# Patient Record
Sex: Male | Born: 1975 | Race: Black or African American | Hispanic: No | Marital: Single
Health system: Midwestern US, Community
[De-identification: ages and names within clinical notes are randomized; demographics above are authoritative.]

## PROBLEM LIST (undated history)

## (undated) DIAGNOSIS — I10 Essential (primary) hypertension: Secondary | ICD-10-CM

---

## 2017-10-20 ENCOUNTER — Inpatient Hospital Stay: Admit: 2017-10-20 | Discharge: 2017-10-20 | Disposition: A | Discharge status: Home / Self Care

## 2017-10-20 DIAGNOSIS — L309 Dermatitis, unspecified: Secondary | ICD-10-CM

## 2017-10-20 MED ORDER — DEXAMETHASONE SOD PHOSPHATE PF 10 MG/ML IJ SOLN
10 MG/ML | Freq: Once | INTRAMUSCULAR | Status: AC
Start: 2017-10-20 — End: 2017-10-20
  Administered 2017-10-20: 19:00:00 10 mg via INTRAMUSCULAR

## 2017-10-20 MED ORDER — CLINDAMYCIN HCL 300 MG PO CAPS
300 MG | ORAL_CAPSULE | Freq: Three times a day (TID) | ORAL | 0 refills | Status: AC
Start: 2017-10-20 — End: 2017-10-30

## 2017-10-20 MED FILL — DEXAMETHASONE SOD PHOSPHATE PF 10 MG/ML IJ SOLN: 10 mg/mL | INTRAMUSCULAR | Qty: 1

## 2017-10-20 NOTE — ED Provider Notes (Signed)
**[facility]  EMERGENCY DEPARTMENT ENCOUNTER      Pt Name: Larry Tyler  RUE:[id number]  Birthdate [birth year]  Date of evaluation: 10/20/2017  Provider: Cain Saupe, PA-C      Chief Complaint:    Chief Complaint   Patient presents with   . Dental Pain     x 2 weeks bilateral sides, top and bottom.    . Other     flare up of his exzema        Nursing Notes, Past Medical Hx, Past Surgical Hx, Social Hx, Allergies, and Family Hx were all reviewed and agreed with or any disagreements were addressed in the HPI.    HPI:  (Location, Duration, Timing, Severity,Quality, Assoc Sx, Context, Modifying factors)  This is a  42 y.o. male who presents to the emergency department today with a chief complaint of eczema exacerbation.  He is seeing a dermatologist and states he was recently prescribed steroids.  He states his dermatologist told him when the steroids were off that his eczema would worsen.  He is here requesting a steroid shot.  He states his dermatologist is starting him on a new medication and advised him he may need a few steroid shots until he is able to start the medication.  Patient denies any possible allergic reaction, shortness of breath, throat swelling or difficulty swallowing.  He has had no fevers or chills.    Patient also complains of chronic dental pain.  Most of his teeth are severely decayed.  He denies facial swelling.     PastMedical/Surgical History:  History reviewed. No pertinent past medical history.  History reviewed. No pertinent surgical history.    Medications:  Discharge Medication List as of 10/20/2017  3:03 PM            Review of Systems:  Review of Systems   Constitutional: Negative for chills and fever.   HENT: Positive for dental problem.    Respiratory: Negative for chest tightness and shortness of breath.    Cardiovascular: Negative for chest pain.   Gastrointestinal: Negative for abdominal  pain, diarrhea, nausea and vomiting.   Genitourinary: Negative for difficulty urinating and dysuria.   Skin: Positive for rash.   Neurological: Negative for dizziness, light-headedness, numbness and headaches.   All other systems reviewed and are negative.    Positives and Pertinent negatives as per HPI.  Except as noted above in the ROS, problem specific ROS was completed and is negative.    Physical Exam:  Physical Exam   Constitutional: He is oriented to person, place, and time. He appears well-developed and well-nourished.   HENT:   Head: Normocephalic and atraumatic.   Mouth/Throat: Dental caries present.   All of patient's remaining teeth are severely decayed.  There is no sign of acute infection or dental abscess.  Remainder of the HEENT exam is unremarkable.   Eyes: Right eye exhibits no discharge. Left eye exhibits no discharge.   Neck: Normal range of motion. Neck supple.   Cardiovascular: Normal rate, regular rhythm, normal heart sounds and intact distal pulses.   Pulmonary/Chest: Effort normal and breath sounds normal. No respiratory distress.   Musculoskeletal: Normal range of motion.   Neurological: He is alert and oriented to person, place, and time.   Skin: Skin is warm and dry. Rash noted. He is not diaphoretic. No pallor.   Patient has an eczematous rash.  There  is no sign of secondary cellulitis or infection.   Psychiatric: He has a normal mood and affect. His behavior is normal.   Nursing note and vitals reviewed.      MEDICAL DECISION MAKING    Vitals:    Vitals:    10/20/17 1431   BP: (!) 147/99   Pulse: 78   Resp: 16   Temp: 98.4 F (36.9 C)   TempSrc: Oral   SpO2: 97%   Weight: 245 lb (111.1 kg)   Height: 5\' 10"  (1.778 m)       MEDICAL DECISION MAKING / ED COURSE:      PROCEDURES:   Procedures    None    Patient was given:  Medications   dexamethasone (PF) (DECADRON) injection 10 mg (10 mg Intramuscular Given 10/20/17 1500)       Patient presented with a flareup of his eczema.  He is waiting  to be started on a new medication by his dermatologist.  He is given Decadron 10 mg IM at his request. Patient is afebrile and nontoxic in appearance. The rash is currently without the appearance or clinical features to suggest a more emergent diagnosis such as SJS, HSP, TEN, meningococcemia, endocarditis, syphillis, cellulitis, scabies, herpes simplex or erythema multiform, etc.     Patient also presented complaining of chronic dental pain.  He has severely decayed teeth.  There is no sign of acute infection.  He is requesting antibiotics despite advising the patient antibiotics are not really what he needs at this point.  He is adamant about receiving antibiotics and will be given a prescription of clindamycin.  He states he will follow-up with his dentist. Patient denies dysphasia, dyspnea, neck stiffness or odynophagia. There is no brawny edema, uvular deviation, meningismus, stridor, trismus or drooling. I estimate there is LOW risk for a DEEP SPACE INFECTION (e.g., LUDWIG'S ANGINA OR RETROPHARYNGEAL ABSCESS), MENINGITIS, or AIRWAY COMPROMISE. There is also NO HEART MURMUR NOTED ON EXAM thus I consider the discharge disposition reasonable.  Patient will be given the dental clinic list and advised to start calling first thing tomorrow morning to schedule a follow-up visit.  Patient is advised to return to the emergency department with any concerns.       The patient tolerated their visit well.  I evaluated the patient.  The physician was available for consultation as needed.  The patient and / or the family were informed of the results of anytests, a time was given to answer questions, a plan was proposed and they agreed with plan.      CLINICAL IMPRESSION:  1. Eczema, unspecified type    2. Chronic dental pain    3. Pain due to dental caries        DISPOSITION Decision To Discharge 10/20/2017 02:46:07 PM      PATIENT REFERRED TO:  A Dentist    Schedule an appointment as soon as possible for a visit          DISCHARGE MEDICATIONS:  Discharge Medication List as of 10/20/2017  3:03 PM      START taking these medications    Details   clindamycin (CLEOCIN) 300 MG capsule Take 1 capsule by mouth 3 times daily for 10 days May sub Generic and 150 mg capsules and 2x the amount, Disp-30 capsule, R-0Print             DISCONTINUED MEDICATIONS:  Discharge Medication List as of 10/20/2017  3:03 PM                 (  Please note the MDM and HPI sections of this note were completed with a voice recognition program.  Efforts weremade to edit the dictations but occasionally words are mis-transcribed.)    Electronically signed, Cain Saupe, PA-C,          Cain Saupe, PA-C  10/20/17 1545

## 2017-10-20 NOTE — ED Notes (Signed)
Discharge instructions discussed with pt; verbalized understanding. Discussed all new medications (Cleocin) use and side effects; verbalized understanding. Discussed follow up with dentist; verbalized understanding. All questions answered. Pt d/c home with all of belongings.        Janee MornPaige A Jolayne Branson, RN  10/20/17 1512

## 2017-12-18 ENCOUNTER — Inpatient Hospital Stay: Admit: 2017-12-18 | Discharge: 2017-12-18 | Disposition: A | Discharge status: Home / Self Care

## 2017-12-18 DIAGNOSIS — K047 Periapical abscess without sinus: Secondary | ICD-10-CM

## 2017-12-18 MED ORDER — dexamethasone (PF) (DECADRON) injection Soln 10 mg
10 | Freq: Once | INTRAMUSCULAR | Status: AC
Start: 2017-12-18 — End: 2017-12-18
  Administered 2017-12-18: 21:00:00 10 mg via INTRAMUSCULAR

## 2017-12-18 MED ORDER — clindamycin (CLEOCIN) 150 MG capsule
150 | ORAL_CAPSULE | Freq: Four times a day (QID) | ORAL | 0 refills | Status: AC
Start: 2017-12-18 — End: 2017-12-25

## 2017-12-18 MED FILL — DEXAMETHASONE SODIUM PHOSPHATE (PF) 10 MG/ML INJECTION SOLUTION: 10 10 mg/mL | INTRAMUSCULAR | Qty: 1

## 2017-12-18 NOTE — Unmapped (Signed)
[facility] ED Note    Date of service:  12/18/2017    Reason for Visit: Dental Pain and Skin Problem      Patient History     HPI:  This patient comes to the emergency department with a request for a steroid injection, which he has received in the past for eczema flareups.  He reports that over the past week or so he has had increased flaking and itching related to widespread eczema.  He is typically seen by his doctor in [place], where he is from, but is currently staying here in [place] for reasons related to work.  He reports that this kind of injection has helped with his symptoms in the past.  In addition he also complains of left lower jaw pain and swelling which has been present for approximately 7 days.  The pain has been accompanied by intermittent headache.  He reports that he currently is taking erythromycin which was prescribed to him by his primary care physician in [place] for sores from itching his eczema.  He denies any other symptoms including nausea, vomiting   History reviewed. No pertinent past medical history.    History reviewed. No pertinent surgical history.    Ruben Alvarado  reports that he has been smoking Cigarettes.  He has been smoking about 0.50 packs per day. He has never used smokeless tobacco. He reports that he drinks alcohol. He reports that he does not use drugs.    Previous Medications    No medications on file       Allergies:   Allergies as of 12/18/2017 - Fully Reviewed 12/18/2017   Allergen Reaction Noted   ??? Penicillins Anaphylaxis 12/18/2017   ??? Bactrim [sulfamethoxazole-trimethoprim] Other (See Comments) 12/18/2017       Review of Systems     ROS:  Review of Systems   Constitutional: Positive for fever.        She reports subjective fevers over the last week.   HENT: Negative for ear pain.    Respiratory: Negative for shortness of breath.    Cardiovascular: Negative for chest pain.   Gastrointestinal: Negative for  abdominal pain, nausea and vomiting.   Musculoskeletal: Negative for neck pain.   Skin: Positive for itching and rash.   Neurological: Negative for dizziness.           Physical Exam     ED Triage Vitals [12/18/17 1350]   Vital Signs Group      Temp 98.3 ??F (36.8 ??C)      Temp Source Oral      Heart Rate 102      Heart Rate Source Automatic      Resp 17      SpO2 99 %      BP (!) 174/104      MAP (mmHg) 117      BP Location Right arm      BP Method Automatic      Patient Position Sitting   SpO2 99 %   O2 Device        General:  Nontoxic, well-appearing and in no distress. Resting in bed comfortably.   ??  HEENT:   Normocephalic, atraumatic. There is moderate swelling in the left lower perimandibular region of the patient's jaw.  The pain is centered in the area of tooth number 17.  There is very mild pain to pressure being placed on tooth number 17.  There is no abscess present in the oral cavity.  There  is no trismus present.  Uvula is midline.  There is no swelling in the patient's neck.     ??Neck:  Supple, no JVD; trachea midline    ??  Pulmonary:   Clear to auscultation bilaterally. Normal effort. No distress.   ??  Cardiac:  Regular rate and rhythm; without murmurs, rubs, gallops.   ??  Musculoskeletal:  MAE.      Skin:  Warm, dry. No cyanosis or pallor.  This patient has a eczematous rash with skin flaking present on the skin covering his entire body.  There is increased areas of flaking on his head and back.  There are no abscesses.  There is no erythema, fluctuance or other signs of infection present.  ??  Neuro:  A&O x4.  ??  Psych:  Normal mood and affect for clinical situation.         Diagnostic Studies         Emergency Department Procedures         ED Course and MDM     Ruben Alvarado is a 42 y.o. male who presented to the emergency department with Dental Pain and Skin Problem      This is a patient with a history of eczema who comes in requesting a steroid injection for an eczema flareup.  He has had these  injections in the past with relief.  In addition he has tooth pain and swelling in his left lower  mandibular region and in the area surrounding tooth number 17.  He has been taking erythromycin, which was prescribed by his previous primary care physician in [place].  He was prescribed this for what he reports as sores due to scratching himself.  He had not taken all the antibiotic and begin taking it again.  His only other symptom is subjective fevers that have been present over the last week.  He denies any other symptoms including nausea, vomiting, chest pain, shortness of breath, confusion, difficulty moving secretions.  He is alert and oriented ??4.  Vital signs are stable.  On exam there is moderate swelling in the left lower perimandibular region of the patient's jaw.  The pain is centered in the area of tooth number 17.  There is very mild pain to pressure being placed on tooth number 17.  There is no abscess present in the oral cavity.  There is no trismus present.  Uvula is midline.  There is no swelling in the patient's neck.  The patient has an eczematous rash with skin flaking present on the skin covering his entire body.  There is increased areas of flaking on his head and back.  There are no abscesses.  There is no erythema, fluctuance or other signs of infection present.    This patient's presentation is suggestive of dental pain and swelling from a submandibular abscess.  This patient was given a 10 mg injection of Decadron IM here in the emergency department.  He will be discharged with clindamycin for dental abscess as he has a penicillin allergy.Ruben Alvarado  He will be given return precautions and instructions to follow-up with a dentist.      Critical Care Time (Attendings)        Purvis Sheffield. Dallen Bunte, PA  12/18/17 1739

## 2017-12-18 NOTE — ED Triage Notes (Signed)
Patient comes in with complaints of his eczema flaring up and dental pain.

## 2017-12-18 NOTE — Unmapped (Signed)
Take antibiotic as prescribed until they are all gone, regardless of if symptoms resolve.  Discontinue taking erythromycin.  Follow-up with the dentist for further evaluation.  Return to the emergency department with new or worsening symptoms including nausea, vomiting, fevers, chills, abdominal pain, shortness of breath, inability to swallow secretions or other concerning symptoms.

## 2017-12-18 NOTE — Unmapped (Signed)
ED Attending Attestation Note    Date of service:  12/18/2017    This patient was seen by the advanced practice provider.  I have seen and examined the patient, agree with the workup, evaluation, management and diagnosis.  The care plan has been discussed and I concur.      My assessment reveals a 42 y.o. male who presents with dental pain and eczema.  He has diffuse near complete body skin irritation consistent with eczema.  He states that it flares up occasionally.  He recently moved to the [place] area and does not have a primary care doctor.  He additionally complains of left-sided lower dental pain.  He does have some mild swelling to the left lower mandibular region he does not have any trismus, he is tolerating his secretions, and he is breathing easily.

## 2018-01-17 ENCOUNTER — Inpatient Hospital Stay: Admit: 2018-01-17 | Discharge: 2018-01-18 | Disposition: A | Discharge status: Home / Self Care

## 2018-01-17 ENCOUNTER — Emergency Department: Admit: 2018-01-17

## 2018-01-17 DIAGNOSIS — L309 Dermatitis, unspecified: Secondary | ICD-10-CM

## 2018-01-17 MED ORDER — triamcinolone acetonide (KENALOG-10) injection 10 mg
10 | Freq: Once | INTRAMUSCULAR | Status: AC
Start: 2018-01-17 — End: 2018-01-18

## 2018-01-17 MED ORDER — triamcinolone (KENALOG) 0.1 % cream
0.1 | Freq: Two times a day (BID) | TOPICAL | 0 refills | 1.00000 days | Status: AC
Start: 2018-01-17 — End: 2018-06-29

## 2018-01-17 MED ORDER — triamcinolone (KENALOG) 0.1 % cream
0.1 | Freq: Two times a day (BID) | TOPICAL | Status: AC
Start: 2018-01-17 — End: 2018-01-18
  Administered 2018-01-17: 23:00:00 via TOPICAL

## 2018-01-17 MED ORDER — hydrOXYzine HCl (ATARAX) tablet 25 mg
25 | Freq: Once | ORAL | Status: AC
Start: 2018-01-17 — End: 2018-01-17
  Administered 2018-01-17: 22:00:00 25 mg via ORAL

## 2018-01-17 MED ORDER — ketorolac (TORADOL) injection 15 mg
15 | Freq: Once | INTRAMUSCULAR | Status: AC
Start: 2018-01-17 — End: 2018-01-17
  Administered 2018-01-17: 22:00:00 15 mg via INTRAMUSCULAR

## 2018-01-17 MED FILL — KETOROLAC 15 MG/ML INJECTION SOLUTION: 15 15 mg/mL | INTRAMUSCULAR | Qty: 1

## 2018-01-17 MED FILL — TRIAMCINOLONE ACETONIDE 0.1 % TOPICAL CREAM: 0.1 0.1 % | TOPICAL | Qty: 15

## 2018-01-17 MED FILL — KENALOG 10 MG/ML SUSPENSION FOR INJECTION: 10 10 mg/mL | INTRAMUSCULAR | Qty: 1

## 2018-01-17 MED FILL — HYDROXYZINE HCL 25 MG TABLET: 25 25 MG | ORAL | Qty: 1

## 2018-01-17 NOTE — ED Notes (Signed)
Contacted pharmacy about missing medication. They stated the tube station has been down but is now working and it should be on it's way via tube station and that if I don't receive it soon to call them back. Patient updated.

## 2018-01-17 NOTE — ED Notes (Signed)
Reviewed d/c paperwork with patient. Patient verbalized understanding. No IV in place. Patient ambulated to exit, gait steady.

## 2018-01-17 NOTE — Unmapped (Signed)
Patient presents to ER triage with c/o eczema exacerbation. He reports rash on arms, legs, and chest, and neck. He also c/o right thumb/joint pain.

## 2018-01-17 NOTE — Unmapped (Addendum)
Apply triamcinolome to areas twice daily. In the meantime, apply lotion to areas. follow-up with dermatology as an outpatient. Call about the injection prescription you are anticipated receiving soon.  No signs of infection at this time.  Return if experience persistent fevers, nausea/vomiting, warmth or redness to the area, or significantly worsening pain.

## 2018-01-17 NOTE — ED Notes (Signed)
Patient states he does not wish to wait any longer for the medication. MD notified.

## 2018-01-17 NOTE — Unmapped (Signed)
[facility] ED Note        Patient History     HPI: Ruben Alvarado is a 42 y.o. male with PMH eczema who presents with complaints of worsening eczema. The patient states that he follows with the dermatologist in [place], but has been in [place] for the past month and a half due to a job.  Patient states that he has been unable to Right back to [place] he had.  Patient states that he is currently in the process of being prescribed a vaccination for his eczema, stating that this should be approved within the next week.  Patient has been applying Eucerin cream to the skin, but states his bowel movements triamcinolone cream, and cannot afford this, so feels his symptoms are worsening.  Patient endorses diffuse pruritic dry skin to bilateral arms and legs.  Patient states he feels his skin cracking when he is around a lot of heat, especially at work around the fractures.  Patient does also endorse right thumb pain that has been worsening over the past few days and worsens when he flexes the thumb.  Has not taken anything yet for his symptoms.  Denies recent work injury.  Denies fevers, chills, erythematous skin, drainage from an area in skin, new soaps/improved, nausea or vomiting.     History reviewed. No pertinent past medical history.    History reviewed. No pertinent surgical history.     reports that he has been smoking cigarettes. He has been smoking about 0.50 packs per day. He has never used smokeless tobacco. He reports current alcohol use. He reports that he does not use drugs.    Discharge Medication List as of 01/17/2018  8:37 PM          Allergies:   Allergies as of 01/17/2018 - Fully Reviewed 01/17/2018   Allergen Reaction Noted   ??? Penicillins Anaphylaxis 12/18/2017   ??? Bactrim [sulfamethoxazole-trimethoprim] Other (See Comments) 12/18/2017       Review of Systems     ROS: ROS    All other ROS were negative.    Physical Exam     ED Triage Vitals  [01/17/18 1524]   Vital Signs Group      Temp 97.5 ??F (36.4 ??C)      Temp Source Oral      Heart Rate 90      Heart Rate Source Automatic      Resp 16      SpO2 97 %      BP 136/84      MAP (mmHg) 96      BP Location Right arm      BP Method Automatic      Patient Position Sitting   SpO2 97 %   O2 Device None (Room air)     Vitals:    01/17/18 1524   BP: 136/84   BP Location: Right arm   Patient Position: Sitting   Pulse: 90   Resp: 16   Temp: 97.5 ??F (36.4 ??C)   TempSrc: Oral   SpO2: 97%      Temp Readings from Last 2 Encounters:   01/17/18 97.5 ??F (36.4 ??C) (Oral)   12/18/17 98.3 ??F (36.8 ??C) (Oral)     BP Readings from Last 2 Encounters:   01/17/18 136/84   12/18/17 (!) 174/104     Pulse Readings from Last 2 Encounters:   01/17/18 90   12/18/17 102        Constitutional:  Well developed,  well nourished, no acute distress, non-toxic appearance   Eyes:  Sclera anicteric, conjunctiva normal.  HEENT:  Atraumatic, external ears normal, oropharynx moist.   Neck: normal range of motion, supple.  Respiratory:  No respiratory distress, normal breath sounds, Good air movement   Cardiovascular:  Regular rate, 2+ pulse.  GI:  Soft, nondistended, no rebound, no guarding.   Musculoskeletal:  No edema, no deformities.   Skin:  Extremely dry skin to entire body including scalp, bilateral upper and lower extremities.  Some areas of excoriation likely secondary to scratching, but no warmth or erythema noted.  No areas of drainage able to be expressed.  No signs of superimposed infection.  No mucosal involvement.  Neurologic:  Awake and alert.  Moves all four extremities.  Light touch intact.    Psychiatric:  Normal mood.  Behavior appropriate.      Diagnostic Studies     Labs:      Labs Reviewed - No data to display    Radiology:  XR HAND RIGHT MINIMUM 3-VIEWS  AMB REFERRAL TO DERMATOLOGY  X-ray Hand Right min 3-views   Final Result   IMPRESSION:   No acute osseous abnormality.      Approved by Dagmar Hait, MD on 01/17/2018  5:28 PM EST      I have personally reviewed the images and I agree with this report.      Report Verified by: Micheline Rough, MD at 01/17/2018 5:33 PM EST          Emergency Department Procedures         ED Course and MDM     [name] is a 42 y.o. male who presented to the emergency department with complaints of ongoing skin rash and right thumb pain.    Patient does have evidence of severe eczema to bilateral upper and lower extremities with some areas of excoriation, likely secondary to patient scratching these areas.  No signs of superimposed infection at this time, as there is no warmth, erythema, or any signs of abscesses.  Triamcinolone cream was given in the ED and patient was encouraged to apply cream to areas of worse symptoms.  Kenalog injection was ordered, as patient stated this has helped the symptoms in the past, but did not come up from pharmacy in time, so patient left prior to receiving this medication.  Patient was also given a referral to dermatology for outpatient follow-up.    As for patient's right thumb, patient for range of motion and was able to make okay sign and perform finger opposition with all digits.  X-ray was ordered and showed no abnormality.  Patient was given Toradol for symptoms.  Patient thumb pain is likely related to arthritis.  Discussed that patient may apply ice to the area and that overuse with worsening symptoms.  Discussed if the patient may take Tylenol or ibuprofen as needed for pain.    The patient was given pertinent discharge instructions related to their diagnosis, reasons to return to the emergency department as well as follow up instructions.     Risks, benefits, and alternatives were discussed. At this time the patient has been deemed safe for discharge. My customary discharge instructions including strict return precautions for worsening or new symptoms have been communicated.  Please see AVS for further details.    Meds given in ED or prescribed for  discharge:  Medications   ketorolac (TORADOL) injection 15 mg (15 mg Intramuscular Given 01/17/18 1712)   hydrOXYzine HCl (ATARAX)  tablet 25 mg (25 mg Oral Given 01/17/18 1712)       Clinical Impression:  1. Eczema, unspecified type        Patient Referred to:    No follow-up provider specified.    Discharge Medications:  Discharge Medication List as of 01/17/2018  8:37 PM      START taking these medications    Details   triamcinolone (KENALOG) 0.1 % cream Apply topically 2 times a day., Starting Thu 01/17/2018, Print, Disp-45 g, R-0             No future appointments.      Critical Care Time (Attendings)          Salome Arnt, PA  01/18/18 2030

## 2018-01-30 ENCOUNTER — Emergency Department: Admit: 2018-01-31

## 2018-01-30 DIAGNOSIS — R079 Chest pain, unspecified: Principal | ICD-10-CM

## 2018-01-30 MED ORDER — ondansetron (ZOFRAN) injection 4 mg
4 | Freq: Once | INTRAMUSCULAR | Status: AC
Start: 2018-01-30 — End: 2018-01-31
  Administered 2018-01-31: 05:00:00 4 mg via INTRAVENOUS

## 2018-01-30 NOTE — Unmapped (Signed)
Pt to cec with c/o CP that started 30 minutes ago. Per pt he felt a squeeze and then became SOB. Per pt the pain is still there but much less. The pain did not travel.    ASA per EMS  ekg WNL per EMS

## 2018-01-30 NOTE — Unmapped (Signed)
Pt to ED via EMS with c/o of squeezing chest pain that started less than an hour ago. 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 EMS gave pt 325 mg aspirin en route.

## 2018-01-30 NOTE — Unmapped (Addendum)
[place] ED Note    Date of Service: 01/30/2018  Reason for Visit: Chest Pain      Patient History     HPI  Ruben Alvarado is a 42 y.o. male with past history significant for eczema presents for evaluation of chest pain.  He notes that this began one hour prior while he was walking, described as both sharp and dull in nature.  He notes associated shortness of breath and that this chest pain isn't exertional, nonpleuritic.  This pain is reproducible on palpation.  He denies any trauma or injury.  He notes that earlier donated plasma, which was the 1st time in 1 year.  He notes nausea currently.  He notes some current chills.  He notes that his eczema is chronic on both his upper and lower extremities.  He denies any fevers, vomiting, abdominal pain, constipation, diarrhea.    Other than stated above, no additional aggravating or alleviating factors are noted.    History reviewed. No pertinent past medical history.  History reviewed. No pertinent surgical history.  Patient  reports that he has been smoking cigarettes. He has a 7.50 pack-year smoking history. He has never used smokeless tobacco. He reports current alcohol use. He reports that he does not use drugs.  Previous Medications    TRIAMCINOLONE (KENALOG) 0.1 % CREAM    Apply topically 2 times a day.       Allergies:   Allergies as of 01/30/2018 - Fully Reviewed 01/30/2018   Allergen Reaction Noted   ??? Penicillins Anaphylaxis 12/18/2017   ??? Bactrim [sulfamethoxazole-trimethoprim] Other (See Comments) 12/18/2017       All nursing notes and triage notes were appropriately reviewed in the course of the creation of this note.     Review of Systems     ROS  All other systems are negative except as mentioned in HPI.    Physical Exam     Vitals:    01/30/18 2337   BP: (!) 160/98   BP Location: Left arm   Patient Position: Lying   Pulse: 102   Resp: 26   Temp: 98.8 ??F (37.1 ??C)   TempSrc: Oral   SpO2: 100%        General: ??Shaking in bed  Eyes: ??Pupils reactive. No discharge from eyes   ENT: ??No discharge from nose. OP clear  Neck: ??Supple, trachea midline  Pulmonary: ?? Non-labored breathing. Breath sounds clear bilaterally  Cardiac: ??Regular rate and rhythm. No murmurs  Abdomen: ??Soft. Non-tender. Non-distended  Musculoskeletal: ??No long bone deformity.?? No CVA tenderness.  Tender to palpation over mid sternum.  Vascular: ??Extremities warm and perfused. Normal pulses in all 4 extremities  Skin: ??Dry, no rashes  Extremities: ??Chronic eczematous changes to both upper extremities and lower extremities.  Right lower extremity grossly enlarged compared to left.  Neuro: ??Alert. Moves all four extremities to command. No focal deficit  Neuro: ??Alert and oriented x 4. CN II-XII intact. 5/5 strength in all 4 extremities. Sensation grossly intact to light touch. Speech and mentation normal.?? Gait narrow and stable    Diagnostic Studies     Labs:  Please see electronic medical record for any tests performed in the ED    Radiology:  CT Angio Chest W and or WO   Final Result   IMPRESSION:   No evidence of pulmonary emboli, evaluation of the lingular vessels is limited by motion.      Approved by Ruben Lima, DO on 01/31/2018 1:54 AM EST  I have personally reviewed the images and I agree with this report.      Report Verified by: Ruben Herald, MD at 01/31/2018 2:03 AM EST      X-ray Chest PA and Lateral   Final Result   IMPRESSION:    No acute cardiopulmonary abnormality.      Approved by Ruben Dural, DO on 01/31/2018 12:48 AM EST      I have personally reviewed the images and I agree with this report.      Report Verified by: Ruben Herald, MD at 01/31/2018 1:01 AM EST          EKG:    EKG Interpretation    Interpreted by emergency department physician    Rhythm: normal sinus   Rate: 100  Axis: normal  Ectopy: none  Conduction: normal  ST Segments: no acute change  T Waves: no acute change  Q Waves: none    Clinical Impression: no  acute changes    [facility]    Emergency Department Procedures       ED Course and MDM     Ruben Alvarado is a 42 y.o. male with a history and presentation as described above in HPI.  The patient was evaluated by myself and the ED Attending Physician, Dr. Janee Alvarado, and R4 Dr. Bayard Alvarado. All management and disposition plans were discussed and agreed upon.    Upon presentation, the patient was shaking, afebrile, hypertensive, mildly tachycardic and tachypneic, saturating well on room air.  Initial concern with right unilateral lower extremity edema and chest pain for PE at this time.  D-dimer was elevated at 1.42, CTPA was pursued which did not find any acute pulmonary embolism.  EKG without dynamic changes and negative troponin making myocardial infarction unlikely. EKG is also not consistent with pericarditis. Pneumonia, aortic dissection and pneumothorax are unlikely based on the results of their CXR.  Patient has a HEART score of 2 with obesity, hypertension, description of pain.  Patient at this time admitted to observation unit for deep vein thrombosis study of this right lower extremity with positive d-dimer and echocardiogram for workup of his chest pain.  Lovenox started for this patient for presumed deep vein thrombosis.    Medications received during this ED visit:  Medications   ondansetron (ZOFRAN) injection 4 mg (has no administration in time range)       At this time the patient has been admitted to CDU for further evaluation and management of chest pain, DVT. The patient will continue to be monitored here in the emergency department until which time he is moved to his new treatment location.      Impression     1. Chest pain, unspecified type         Plan   1. The patient is to be admitted in stable/improved condition  2. Workup, treatment and diagnosis were discussed with the patient and/or family members; the patient agrees to the plan and all questions were addressed and answered.    Ruben Coombes, MD, PGY-1  [facility]    Critical Care Time (Attendings)              Ruben Coombes, MD  Resident  01/31/18 1610       Ruben Coombes, MD  Resident  01/31/18 [phone]

## 2018-01-30 NOTE — Unmapped (Signed)
ED Attending ED Observation Disposition Attestation Note    Date of service:  01/30/2018    This patient was seen by the advanced practice provider.  I have seen and examined the patient, agree with the workup, evaluation, management and diagnosis. The care plan has been discussed and I concur.     My assessment reveals a 42 y.o. male who presents to the Emergency Department with syncope.  Underwent surgery.  Workup appears negative including echo he is asymptomatic.

## 2018-01-30 NOTE — ED Notes (Signed)
I was the attending physician responsible for placing this patient into observation.  The patient will be observed for further evaluation of chest pain that occurred while walking.    His breath sounds are clear bilaterally     Karren Burly, MD  01/31/18 2212

## 2018-01-30 NOTE — Unmapped (Signed)
MD at bedside.

## 2018-01-30 NOTE — Unmapped (Signed)
Bed: B28U  Expected date:   Expected time:   Means of arrival:   Comments:  EMS

## 2018-01-30 NOTE — Unmapped (Signed)
ED Attending Attestation Note    Date of service:  01/30/2018    This patient was seen by the resident physician.  I have seen and examined the patient, agree with the workup, evaluation, management and diagnosis. The care plan has been discussed and I concur.  I have reviewed the ECG and concur with the resident's interpretation.    My assessment reveals a 42 y.o. male With past history of tobacco use and severe eczema.  Today the patient donated plasma for the 1st time.  He states that he received a bag of IV fluids with his donation.  Following this he developed pain all over his body.  The patient was walking and he was feeling central chest discomfort and was having difficulty breathing.  The patient then fainted and woke up in the ambulance.  He is continuing to complain of generalized body pain.    On physical exam the patient appears comfortable.  His breath sounds are clear bilaterally, S1-S2

## 2018-01-31 ENCOUNTER — Observation Stay: Admit: 2018-01-31 | Discharge: 2018-05-10 | Payer: MEDICAID

## 2018-01-31 ENCOUNTER — Emergency Department: Admit: 2018-01-31

## 2018-01-31 ENCOUNTER — Inpatient Hospital Stay
Admission: EM | Admit: 2018-01-31 | Discharge: 2018-02-01 | Disposition: A | Discharge status: Home / Self Care | Payer: MEDICAID

## 2018-01-31 DIAGNOSIS — R079 Chest pain, unspecified: Secondary | ICD-10-CM

## 2018-01-31 LAB — CBC
Hematocrit: 43.5 % (ref 38.5–50.0)
Hemoglobin: 14.2 g/dL (ref 13.2–17.1)
MCH: 30.1 pg (ref 27.0–33.0)
MCHC: 32.5 g/dL (ref 32.0–36.0)
MCV: 92.5 fL (ref 80.0–100.0)
MPV: 8.2 fL (ref 7.5–11.5)
Platelets: 248 10*3/uL (ref 140–400)
RBC: 4.7 10*6/uL (ref 4.20–5.80)
RDW: 13.3 % (ref 11.0–15.0)
WBC: 8.5 10*3/uL (ref 3.8–10.8)

## 2018-01-31 LAB — DIFFERENTIAL
Basophils Absolute: 26 /uL (ref 0–200)
Basophils Relative: 0.3 % (ref 0.0–1.0)
Eosinophils Absolute: 255 /uL (ref 15–500)
Eosinophils Relative: 3 % (ref 0.0–8.0)
Lymphocytes Absolute: 995 /uL (ref 850–3900)
Lymphocytes Relative: 11.7 % — ABNORMAL LOW (ref 15.0–45.0)
Monocytes Absolute: 1037 /uL — ABNORMAL HIGH (ref 200–950)
Monocytes Relative: 12.2 % — ABNORMAL HIGH (ref 0.0–12.0)
Neutrophils Absolute: 6188 /uL (ref 1500–7800)
Neutrophils Relative: 72.8 % (ref 40.0–80.0)
nRBC: 0 /100{WBCs} (ref 0–0)

## 2018-01-31 LAB — TROPONIN I
Troponin I: <0.04 ng/mL (ref 0.00–0.03)
Troponin I: <0.04 ng/mL (ref 0.00–0.03)

## 2018-01-31 LAB — BASIC METABOLIC PANEL
Anion Gap: 10 mmol/L (ref 3–16)
BUN: 6 mg/dL — ABNORMAL LOW (ref 7–25)
CO2: 21 mmol/L (ref 21–33)
Calcium: 8.5 mg/dL — ABNORMAL LOW (ref 8.6–10.3)
Chloride: 105 mmol/L (ref 98–110)
Creatinine: 0.88 mg/dL (ref 0.60–1.30)
Glucose: 76 mg/dL (ref 70–100)
Osmolality, Calculated: 278 mosm/kg (ref 278–305)
Potassium: 3.9 mmol/L (ref 3.5–5.3)
Sodium: 136 mmol/L (ref 133–146)
eGFR AA CKD-EPI: >90 See note.
eGFR NONAA CKD-EPI: >90 See note.

## 2018-01-31 LAB — DDIMER: D-Dimer: 1.42 ug{FEU}/mL — ABNORMAL HIGH (ref 0.00–0.50)

## 2018-01-31 LAB — B NATRIURETIC PEPTIDE: BNP: 29 pg/mL (ref 0–100)

## 2018-01-31 MED ORDER — acetaminophen (TYLENOL) tablet 650 mg
325 | ORAL | Status: AC | PRN
Start: 2018-01-31 — End: 2018-01-31

## 2018-01-31 MED ORDER — mineral oil w/petrolatum-lanolin-ceresin (EUCERIN)
TOPICAL | Status: AC | PRN
Start: 2018-01-31 — End: 2018-02-01
  Administered 2018-01-31: 16:00:00 via TOPICAL

## 2018-01-31 MED ORDER — OMNIPAQUE (iohexol) 350 mg iodine/mL 100 mL
350 | Freq: Once | INTRAVENOUS | Status: AC | PRN
Start: 2018-01-31 — End: 2018-01-31
  Administered 2018-01-31: 06:00:00 83 mL via INTRAVENOUS

## 2018-01-31 MED ORDER — enoxaparin (LOVENOX) subcutaneous syringe
150 | SUBCUTANEOUS | Status: AC
Start: 2018-01-31 — End: 2018-02-01
  Administered 2018-01-31: 11:00:00 150 mg/kg via SUBCUTANEOUS

## 2018-01-31 MED ORDER — perflutren lipid microspheres (DEFINITY) injection 2.2 mg
1.1 | Freq: Once | INTRAVENOUS | Status: AC | PRN
Start: 2018-01-31 — End: 2018-01-31
  Administered 2018-01-31: 21:00:00 2.2 mg via INTRAVENOUS

## 2018-01-31 MED ORDER — acetaminophen (TYLENOL) suppository 650 mg
650 | RECTAL | Status: AC | PRN
Start: 2018-01-31 — End: 2018-01-31

## 2018-01-31 MED ORDER — ondansetronZOFRANtablet4mg
4 | Freq: Four times a day (QID) | ORAL | Status: AC | PRN
Start: 2018-01-31 — End: 2018-02-01

## 2018-01-31 MED ORDER — ondansetron (ZOFRAN) tablet 4 mg
4 | Freq: Four times a day (QID) | ORAL | Status: AC | PRN
Start: 2018-01-31 — End: 2018-01-31

## 2018-01-31 MED ORDER — ondansetron (ZOFRAN) injection 4 mg
4 | Freq: Four times a day (QID) | INTRAMUSCULAR | Status: AC | PRN
Start: 2018-01-31 — End: 2018-01-31

## 2018-01-31 MED ORDER — ondansetron (ZOFRAN) injection 4 mg
4 | Freq: Four times a day (QID) | INTRAMUSCULAR | Status: AC | PRN
Start: 2018-01-31 — End: 2018-02-01

## 2018-01-31 MED FILL — OMNIPAQUE 350 MG IODINE/ML INTRAVENOUS SOLUTION: 350 350 mg iodine/mL | INTRAVENOUS | Qty: 100

## 2018-01-31 MED FILL — ONDANSETRON HCL (PF) 4 MG/2 ML INJECTION SOLUTION: 4 4 mg/2 mL | INTRAMUSCULAR | Qty: 2

## 2018-01-31 MED FILL — ENOXAPARIN 150 MG/ML SUBCUTANEOUS SYRINGE: 150 150 mg/mL | SUBCUTANEOUS | Qty: 1

## 2018-01-31 MED FILL — DEFINITY 1.1 MG/ML INTRAVENOUS SUSPENSION: 1.1 1.1 mg/mL | INTRAVENOUS | Qty: 2

## 2018-01-31 MED FILL — MINERIN CREME TOPICAL: TOPICAL | Qty: 454

## 2018-01-31 NOTE — ED Notes (Signed)
RN gave pt sandwich, crackers, peanut butter. MD aware. Pt sitting in bed eating.

## 2018-01-31 NOTE — ED Notes (Signed)
Pt on phone ordering diet tray

## 2018-01-31 NOTE — ED Notes (Signed)
Food tray at bedside.

## 2018-01-31 NOTE — ED Notes (Signed)
Patient to echo via transport and stretcher.

## 2018-01-31 NOTE — Unmapped (Signed)
Patient has been transported to ERCT

## 2018-01-31 NOTE — Unmapped (Signed)
Bed: CD05U  Expected date:   Expected time:   Means of arrival:   Comments:  c35- Brinlee Gambrell, RN [phone]

## 2018-01-31 NOTE — Unmapped (Signed)
Bed: CD13U  Expected date:   Expected time:   Means of arrival:   Comments:  [id number]

## 2018-01-31 NOTE — ED Notes (Signed)
Pt given bedside bath with minimal asisstance. Cream applied for pt eczema. Pt linens and gown changed.

## 2018-01-31 NOTE — Unmapped (Signed)
Assisted patient to the bathroom, he is steady.  He is scratching his skin excessively on his abdomen, until he is bleeding.  Patient off unit for echo.

## 2018-01-31 NOTE — Unmapped (Signed)
Center for Emergency Care Clinical Decision Unit  [facility]  Admission Note/Summary    Date of placement in ED Observation:  No order of PLACE IN ED OBSERVATION is found.      Patient History      Ruben Alvarado is a 42 y.o. male who presented to the Emergency Department with episode of chest pain and syncope.  The patient was walking into a store when he experienced sudden onset of substernal chest pain with associated diaphoresis, nausea and shortness of breath.  His symptoms lasted for 2-3 minutes.  At that time the patient had an episode of syncope and woke up in the ambulance.  The patient did donate plasma prior to these symptoms, and he did receive IV fluids with his donation.  The patient's pain is reproducible on examination.  He denies any trauma or injury to his chest that he noticed.  He denies any associated abdominal pain or vomiting.  On physical exam the providers also noticed right lower extremity swelling.  The primary, initial concern was for PE given the patient's pain combined with syncope.  The acute evaluation for chest pain included a negative troponin, elevated d-dimer at 1.42, CTPA without evidence of pulmonary embolism and EKG without dynamic changes.  He was given Lovenox in the ED given his RLE swelling and elevated d-dimer until we are able to obtain the RLE duplex.     Upon admission to the Clinical Decision Unit, Ruben Alvarado is resting comfortably in bed.  The patient did last consume caffeine at approximately 9 PM so he is not a candidate for GXT, though is low risk with a heart score of 2 calculated by the ED.      Past Medical History   has no past medical history on file.    Past Surgical History   has no past surgical history on file.    Social History  Joanna Drapeau  reports that he has been smoking cigarettes. He has a 7.50 pack-year smoking history. He has never used smokeless tobacco. He reports current alcohol use. He reports that he does  not use drugs.    Family History  Patient denies any pertinent family history as it pertains to the current problem     Medications      Previous Medications    TRIAMCINOLONE (KENALOG) 0.1 % CREAM    Apply topically 2 times a day.          Review of Systems      Positive for chest pain, shortness of breath, nausea, diaphoresis, leg swelling, syncope.  Negative for abdominal pain, vomiting, lightheadedness.     Physical examination      Vitals:    01/30/18 2337 01/31/18 0105 01/31/18 0250 01/31/18 0351   BP: (!) 160/98 145/77 137/79 124/62   BP Location: Left arm      Patient Position: Lying      Pulse: 102 90 93 94   Resp: 26 18 19 12    Temp: 98.8 ??F (37.1 ??C)      TempSrc: Oral      SpO2: 100% 100% 100% 99%       General: 42 y.o. male in no apparent distress, well developed, well nourished, non-toxic appearance. Wrapped in blankets, laying in bed.     HEENT: Atraumatic, normocephalic. EOMs intact.?? Pupils constricted but equal and reactive to light.     Neck:  Full range of motion.     Chest/pulm: Respiratory rate normal. Speaks  in complete sentences, no respiratory distress. Lungs clear without obvious wheezing, rales    Cardiovascular: Heart rate normal. Regular rate and rhythm, no obvious murmurs.     Abdomen: soft without any focal tenderness, rebound or guarding.  No distention.    Musculoskeletal: Moves all extremities equally.  Right lower extremity edema without any overlying erythema or palpable cords.    Neuro: A&O x 4.?? Normal speech without dysarthria or aphasia. Moves all extremities spontaneously and symmetrically.     Skin: Warm, dry. No obvious rashes, petechiae, or cyanosis.     Psych: Appropriate mood and affect, normal interaction.        Diagnostic evaluation      Diagnostic studies and ED interventions germane to this period of clinical observation will include:    - ECHO, given syncope  - RLE duplex           Consultant(s)      1) n/a     Impression and Plan      In summary, Ruben Alvarado is  admitted by to the [facility] for Emergency Care Clinical Decision Unit for chest pain, syncope.  Dr. Janee Morn is the CDU admission attending.      This patient has been risk-stratified based on the available history, physical exam, and related study findings.  Admission to observation status for further diagnosis/treatment/monitoring of chest pain, syncope is warranted clinically.  This extended period of observation is specifically required to determine the need for hospitalization.      The goals of this admission based on the patient's clinical problem list are:    1. Monitor for hemodynamic stability within normal limits  2. Trending troponins  3. RLE duplex  4. ECHO    We will observe the patient for the following endpoints:    1.  No acute events over CDU observation, specifically no recurrent symptoms and no arrhythmias   2. Maintenance of normal, stable vital signs, including orthostatic  3. Normal TTE  4. Serially negative troponin  5. No new changes to initial ECG  6. Improved lab abnormalities  7. Appropriate follow-up in place     When met, appropriate disposition will be arranged.

## 2018-01-31 NOTE — Unmapped (Signed)
Pt resting in bed with lights dimmed. SV rhythm with even and unlabored respirations. Bed is locked and in the lowest position with side rails up and call light within reach. No signs of distress or complaints at this time.

## 2018-01-31 NOTE — Unmapped (Addendum)
You're near fainting today was likely a result of donating plasma.  This is rather common.  Continue to hydrate well over the next 24 hours, eat solid meals, and return to emergency room if increased chest pain/fainting/shortness of breath.    Your echo of your heart and your ultrasound of her leg were normal today.    Apply the provided cream to areas of dry skin consistently throughout the day.  He may apply petroleum jelly as well to retain moisture.  Keep your lower extremities wrapped in the provided ace wraps.  Elevate her legs above the level of her heart several times per day.

## 2018-01-31 NOTE — Unmapped (Signed)
Center for Emergency Care Clinical Decision Unit  [facility]  Progress Note       Subjective      Clements Posadas has been admitted to the CDU for 6 hours.  Serial assessments of his clinical progress reveal:    - Experiencing acute on chronic itching/burning of his back and legs secondary to eczema.  - Otherwise no SOA, CP.       Physical examination     Vital signs:   Vitals:    01/31/18 0351 01/31/18 0506 01/31/18 0539 01/31/18 0659   BP: 124/62  112/47 140/78   BP Location:   Left arm    Patient Position:   Lying    Pulse: 94  92 84   Resp: 12  18 18    Temp:       TempSrc:       SpO2: 99%  97% 100%   Weight:  220 lb (99.8 kg)         General:??well appearing in no apparent state of distress  HEENT:?? normocephalic, atraumatic;  handling secretions  Pulmonary:??respirations even, non labored  Abdomen:?? non-distended, non-tender  Musculoskeletal:?? ambulates under own control, BLE right worse than left.   Neuro: GCS 15, spontaneously moves all extremities  Vascular:?? 2+ peripheral pulses in bilateral upper and lower extremities ??  Skin:?? warm, total body eczema with skin cracking     Assessment and Plan      Fisher Ramone continues to be managed in accordance with the CDU clinical guidelines for syncope, CP.  An update of his clinical problem list includes:    Syncope, CP  ?? CTPA negative for PE  ?? HEART score 2  ?? ECHO today  ?? Likely vasovagal secondary to plasma donation given. No need for stress test today  ?? Hemodynamic monitoring    RLE Edema.   ?? Venous duplex  ?? DVT vs chronic edema secondary to severe eczema  ?? Received 150mg  lovenox in ED at 0537    ---------------------------------------------------------    Helmut Muster PA-C

## 2018-01-31 NOTE — Unmapped (Signed)
MD at bedside.

## 2018-01-31 NOTE — Unmapped (Signed)
Center for Emergency Care Clinical Decision Unit  [facility]  Disposition Note / Summary     Date of placement in ED Observation:   Last order of PLACE IN ED OBSERVATION was found on 01/31/2018 from Hospital Encounter on 01/30/2018        Subjective      Ruben Alvarado has undergone comprehensive diagnostic evaluation and therapeutic management in accordance with the CDU guidelines for near syncope, ?CP. Based on the clinical response and diagnostic information obtained during this period of observation, it has been determined that the patient will be discharged home.       Physical examination      Vital signs:   Vitals:    01/31/18 0659 01/31/18 1105 01/31/18 1443 01/31/18 1454   BP: 140/78  139/78    BP Location:   Left arm    Patient Position:   Lying    Pulse: 84 79 100    Resp: 18 16 16     Temp:   98.1 ??F (36.7 ??C)    TempSrc:   Oral    SpO2: 100% 100% 99%    Weight:    218 lb (98.9 kg)   Height:    5' 10 (1.778 m)       General:??Well appearing, well nourished, in no apparent state of distress.     HEENT:?? Normocephalic, atraumatic. Pupils equal, sclera white. Handling secretions without difficulty.    Neck:??No meningismus. Trachea midline.    Pulmonary:??Respirations even. Non labored. No tachypnea. Good air movement throughout.    Cardiac:??Chest symmetrical. Non-tender on palpation of chest wall. RRR. No M/R/G.     Abdomen:?? Non-distended. Non rigid and non tender to palpation.     Musculoskeletal:?? Ambulates under own control. Atraumatic exam with no focal swelling or tenderness. No peripheral edema. Strength 5/5 in all four extremities.    Neuro:?? Alert and oriented x 3. CN II - XII grossly intact. Moves all extremities spontaneously.    Vascular:?? 2+ peripheral pulses in bilateral upper and lower extremities ??    Skin:?? Warm and well perfused    Psych:?? Appropriate mood and affect ??       Diagnostic evaluation     Diagnostic studies germane to this period of clinical  observation include:    Venous Duplex Lower Ext Ltd - Right   Final Result      CT Angio Chest W and or WO   Final Result   IMPRESSION:   No evidence of pulmonary emboli, evaluation of the lingular vessels is limited by motion.      Approved by Jodean Lima, DO on 01/31/2018 1:54 AM EST      I have personally reviewed the images and I agree with this report.      Report Verified by: Zane Herald, MD at 01/31/2018 2:03 AM EST      X-ray Chest PA and Lateral   Final Result   IMPRESSION:    No acute cardiopulmonary abnormality.      Approved by Ezzie Dural, DO on 01/31/2018 12:48 AM EST      I have personally reviewed the images and I agree with this report.      Report Verified by: Zane Herald, MD at 01/31/2018 1:01 AM EST          Labs Reviewed   BASIC METABOLIC PANEL - Abnormal; Notable for the following components:       Result Value    BUN 6 (*)  Calcium 8.5 (*)     All other components within normal limits   DIFFERENTIAL - Abnormal; Notable for the following components:    Lymphocytes Relative 11.7 (*)     Monocytes Relative 12.2 (*)     Monocytes Absolute 1,037 (*)     All other components within normal limits   DDIMER - Abnormal; Notable for the following components:    D-Dimer 1.42 (*)     All other components within normal limits   CBC   TROPONIN I   B NATRIURETIC PEPTIDE   TROPONIN I        Consultant(s) final recommendations      1)      Impression and Plan      Ruben Alvarado has been cared for according to the standard [facility] for Emergency Care Clinical Decision Unit observation protocol for near syncope. This extended period of observation was specifically required to determine the need for hospitalization. Prior to discharge from observation, the final physical exam is documented above.      Syncope, CP  ?? CTPA negative for PE  ?? HEART score 2  ?? ECHO today normal  ?? Likely vasovagal secondary to plasma donation. No need for stress test given low HEART score.  ?? Hemodynamic monitoring  ??  RLE  Edema.   ?? Venous duplex negative for DVT  ?? Likely chronic edema secondary to severe eczema  ?? Received 150mg  lovenox in ED at 0537  ?? Cream and compressive dressings applied  ?? Dermatology follow up provided  ??    Based on the patient's condition and test results, the patient will be Discharge home.  See AVS for patient instructions..    The total length of observation was 15 hours. Dr. Tyrell Antonio is the CDU disposition attending.    The patient will follow-up with:    [facility]  [address], [place] [address]  [place] [address]  [phone]  Schedule an appointment as soon as possible for a visit   Once your medicaid clears      As appropriate, please see the AVS for comprehensive discharge instructions.

## 2018-02-05 ENCOUNTER — Inpatient Hospital Stay: Admit: 2018-02-05 | Discharge: 2018-02-05 | Disposition: A | Discharge status: Home / Self Care | Payer: MEDICAID

## 2018-02-05 ENCOUNTER — Emergency Department: Admit: 2018-02-05 | Payer: MEDICAID

## 2018-02-05 DIAGNOSIS — M7989 Other specified soft tissue disorders: Secondary | ICD-10-CM

## 2018-02-05 LAB — DIFFERENTIAL
Basophils Absolute: 38 /uL (ref 0–200)
Basophils Relative: 0.4 % (ref 0.0–1.0)
Eosinophils Absolute: 665 /uL (ref 15–500)
Eosinophils Relative: 7 % (ref 0.0–8.0)
Lymphocytes Absolute: 1539 /uL (ref 850–3900)
Lymphocytes Relative: 16.2 % (ref 15.0–45.0)
Monocytes Absolute: 789 /uL (ref 200–950)
Monocytes Relative: 8.3 % (ref 0.0–12.0)
Neutrophils Absolute: 6470 /uL (ref 1500–7800)
Neutrophils Relative: 68.1 % (ref 40.0–80.0)
nRBC: 0 /100 WBC (ref 0–0)

## 2018-02-05 LAB — HEPATIC FUNCTION PANEL
ALT: 16 U/L (ref 7–52)
AST: 22 U/L (ref 13–39)
Albumin: 4.1 g/dL (ref 3.5–5.7)
Alkaline Phosphatase: 58 U/L (ref 36–125)
Bilirubin, Direct: 0 mg/dL (ref 0.0–0.4)
Bilirubin, Indirect: 0.3 mg/dL (ref 0.0–1.1)
Total Bilirubin: 0.3 mg/dL (ref 0.0–1.5)
Total Protein: 6.8 g/dL (ref 6.4–8.9)

## 2018-02-05 LAB — BASIC METABOLIC PANEL
Anion Gap: 9 mmol/L (ref 3–16)
BUN: 4 mg/dL (ref 7–25)
CO2: 25 mmol/L (ref 21–33)
Calcium: 9.2 mg/dL (ref 8.6–10.3)
Chloride: 104 mmol/L (ref 98–110)
Creatinine: 0.99 mg/dL (ref 0.60–1.30)
Glucose: 85 mg/dL (ref 70–100)
Osmolality, Calculated: 282 mOsm/kg (ref 278–305)
Potassium: 4.2 mmol/L (ref 3.5–5.3)
Sodium: 138 mmol/L (ref 133–146)
eGFR AA CKD-EPI: >90 See note.
eGFR NONAA CKD-EPI: >90 See note.

## 2018-02-05 LAB — CBC
Hematocrit: 43 % (ref 38.5–50.0)
Hemoglobin: 14.1 g/dL (ref 13.2–17.1)
MCH: 30.3 pg (ref 27.0–33.0)
MCHC: 32.8 g/dL (ref 32.0–36.0)
MCV: 92.5 fL (ref 80.0–100.0)
MPV: 8.7 fL (ref 7.5–11.5)
Platelets: 300 10*3/uL (ref 140–400)
RBC: 4.64 10*6/uL (ref 4.20–5.80)
RDW: 13.6 % (ref 11.0–15.0)
WBC: 9.5 10*3/uL (ref 3.8–10.8)

## 2018-02-05 LAB — B NATRIURETIC PEPTIDE: BNP: 97 pg/mL (ref 0–100)

## 2018-02-05 MED ORDER — acetaminophen (TYLENOL) tablet 975 mg
325 | Freq: Once | ORAL | Status: AC
Start: 2018-02-05 — End: 2018-02-05
  Administered 2018-02-05: 20:00:00 975 mg via ORAL

## 2018-02-05 MED ORDER — ibuprofen (ADVIL,MOTRIN) tablet 800 mg
800 | Freq: Once | ORAL | Status: AC
Start: 2018-02-05 — End: 2018-02-05
  Administered 2018-02-05: 20:00:00 800 mg via ORAL

## 2018-02-05 MED FILL — TYLENOL 325 MG TABLET: 325 325 mg | ORAL | Qty: 3

## 2018-02-05 MED FILL — IBUPROFEN 800 MG TABLET: 800 800 MG | ORAL | Qty: 1

## 2018-02-05 NOTE — Unmapped (Addendum)
You were seen in the emergency department for leg swelling.     You need to follow-up with a primary care doctor    You should return to the emergency department if your symptoms worsen or do not resolve. In addition, return if:  - You have a fever (greater than 101 degrees)  - You have chest pain, shortness of breath, abdominal pain, or uncontrollable vomiting  - You are unable to eat or drink  - You pass out  - You have difficulty moving your arms or legs   - You have difficulty speaking or slurred speech  - Or you have any concern that you feel needs acute physician evaluation.

## 2018-02-05 NOTE — Unmapped (Signed)
ED Attending Attestation Note    Date of service:  02/05/2018    This patient was seen by the resident physician.  I have seen and examined the patient, agree with the workup, evaluation, management and diagnosis. The care plan has been discussed and I concur.     My assessment reveals a 42 y.o. male Here with ongoing right lower extremity swelling which she feels is worsening since his recent evaluation in the emergency department.  He has distal pulses and symmetric ABIs.  Does have skin swelling and weeping circumferentially with asymmetry in the right lower extremity, motor and sensory function are intact.  Abdomen is soft and nontender.Ruben Alvarado

## 2018-02-05 NOTE — Unmapped (Signed)
Patients ABI score was 1.59mmHG on the right and 1.16 mmHg on the left

## 2018-02-05 NOTE — Unmapped (Signed)
[facility] ED Note    Date of Service: 02/05/2018  Reason for Visit: Leg Swelling      Patient History     HPI  Ruben Alvarado is a 42 y.o. male with no significant past medical history presenting with subacute Right lower extremity pain and swelling.  States the pain started about 1 week ago.    Describes the pain as sharp.  Denies any chest pain or shortness of breath with the pain.  Does say he intermittently has swelling of the left lower extremity.  Note since he was last seen in the emergency department for this same complaint on 20 November, he is noted worsening swelling of his left lower extremity.  Denies any trauma to the extremity, fevers or chills.    At that time during his ED visit on 1120, patient had negative duplex ultrasound of the right location as well as negative CTPA.       Other than stated above, no additional aggravating or alleviating factors are noted.    History reviewed. No pertinent past medical history.  History reviewed. No pertinent surgical history.  Patient  reports that he has been smoking cigarettes. He has a 7.50 pack-year smoking history. He has never used smokeless tobacco. He reports current alcohol use. He reports that he does not use drugs.  Previous Medications    TRIAMCINOLONE (KENALOG) 0.1 % CREAM    Apply topically 2 times a day.       Allergies:   Allergies as of 02/05/2018 - Fully Reviewed 01/31/2018   Allergen Reaction Noted   ??? Penicillins Anaphylaxis 12/18/2017   ??? Bactrim [sulfamethoxazole-trimethoprim] Other (See Comments) 12/18/2017       All nursing notes and triage notes were appropriately reviewed in the course of the creation of this note.     Review of Systems     Review of Systems   Constitutional: Negative for chills and fever.   HENT: Negative for congestion and sore throat.    Eyes: Negative for pain and discharge.   Respiratory: Negative for cough and shortness of breath.    Cardiovascular: Positive  for leg swelling. Negative for chest pain.   Gastrointestinal: Negative for diarrhea, nausea and vomiting.   Genitourinary: Negative for dysuria and hematuria.   Musculoskeletal: Positive for myalgias. Negative for back pain and neck pain.   Skin: Negative for rash.   Neurological: Negative for sensory change and focal weakness.   All other systems reviewed and are negative.    Physical Exam     Vitals:    02/05/18 1142   BP: 150/85   BP Location: Right arm   Patient Position: Sitting   Pulse: 85   Resp: 18   Temp: 97.5 ??F (36.4 ??C)   TempSrc: Oral   SpO2: 100%       Physical Exam  Constitutional:       General: He is not in acute distress.     Appearance: He is well-developed.   HENT:      Head: Normocephalic and atraumatic.      Nose: Nose normal.   Eyes:      Pupils: Pupils are equal, round, and reactive to light.   Neck:      Musculoskeletal: Normal range of motion and neck supple.      Trachea: No tracheal deviation.   Cardiovascular:      Rate and Rhythm: Normal rate and regular rhythm.      Heart sounds: Normal heart sounds. No  murmur. No friction rub.   Pulmonary:      Effort: Pulmonary effort is normal.      Breath sounds: Normal breath sounds. No wheezing or rales.   Abdominal:      Palpations: Abdomen is soft.      Tenderness: There is no tenderness. There is no guarding.   Musculoskeletal: Normal range of motion.         General: No deformity.   Skin:     General: Skin is warm and dry.      Findings: No rash.   Neurological:      Mental Status: He is alert and oriented to person, place, and time.      Cranial Nerves: No cranial nerve deficit.      Sensory: No sensory deficit.   Psychiatric:         Behavior: Behavior normal.       Diagnostic Studies     Labs:  Please see electronic medical record for any tests performed in the ED    Radiology:  X-ray Tibia-Fibula Right min 2-views   Final Result   IMPRESSION:      Diffuse soft tissue edema without acute osseous abnormality.      Report Verified by:  Duncan Dull, MD at 02/05/2018 2:27 PM EST          EKG:  No EKG Performed  Emergency Department Procedures   None    ED Course and MDM     Ruben Alvarado is a 42 y.o. male with a history and presentation as described above in HPI.  The patient was evaluated by myself and the ED Attending Physician and R4. All management and disposition plans were discussed and agreed upon.    Upon presentation, the patient was well-appearing, afebrile and hemodynamically stable . X-ray demonstrated soft tissue swelling, no evidence of free air or fracture..  On physical exam, patient is neurovascularly intact with skin changes that appear to be consistent with venous stasis..  Compartments were soft, with normal distal pulses, and no poikilothermia or paresthesias therefore compartment syndrome is unlikely.  There was no sign of injury to the proximal or distal joints.  Patient artery with negative venous duplex ultrasound and CT PA solo concern for deep vein thromboses.  Workup including renal function, hepatic function and BNP were obtained which were within normal limits.  Patient's encouraged to follow up with primary care doctor concerning THE SWELLING AND WAS GIVEN A PRESCRIPTION FOR COMPRESSION STOCKING.          Medications received during this ED visit:  Medications   acetaminophen (TYLENOL) tablet 975 mg (975 mg Oral Given 02/05/18 1439)   ibuprofen (ADVIL,MOTRIN) tablet 800 mg (800 mg Oral Given 02/05/18 1439)     At this time, the patient was deemed appropriate for discharge. My customary discharge instructions, including strict return precautions for new or worsening symptoms concerning to the patient, were provided. All of patient's questions were answered satisfactorily, and he was subsequently sent home in stable condition.      Impression     1. Leg swelling         Plan     1. The patient is to be discharged home in stable/improved condition.  2. Workup, treatment and diagnosis were discussed with the patient  and/or family members; the patient agrees to the plan and all questions were addressed and answered.  3. The patient is instructed to return to the emergency department should his symptoms worsen or  any concern he believes warrants acute physician evaluation.      Desmond Lope, MD, PGY-1  [facility]           Desmond Lope, MD  Resident  02/05/18 [phone]

## 2018-02-05 NOTE — Unmapped (Signed)
Pt coming in from home with right leg pain and swelling. Pt was seen here on 11/20 and had venous duplex of RLE performed on 11/21 which was WNL. Pt reports pain and swelling has gotten worse in the last two days. Pt is alert and oriented x4, calm and cooperative, ambulatory.

## 2018-02-26 ENCOUNTER — Ambulatory Visit: Payer: MEDICAID | Attending: Student in an Organized Health Care Education/Training Program

## 2018-02-26 NOTE — Unmapped (Deleted)
[facility]  INTERNAL MEDICINE RESIDENT PRACTICE  [address] [place] [address]    Name:  Ruben Alvarado  Date of Birth: [birth year] (42 y.o.)   MRN: [id number]    Date of Service:  02/25/2018     Subjective  Chief Complaint:   No chief complaint on file.      History of Present Illness:   HPI   Ruben Alvarado is a 42 y.o. male with history of *** here today for the following:         Health Maintenance Due   Topic Date Due   ??? Diabetes Screening  [birth year]   ??? Tobacco Cessation Readiness  [birth year]01/[birth year]   ??? Comprehensive Physical Exam  08-11-1974   ??? Immunization: Pneumococcal (1 of 1 - PPSV23) 03/12/1981   ??? Alcohol Misuse Screening  03/12/1993   ??? HIV Screening  03/12/1993   ??? Depression Screening  03/12/1993   ??? Immunization: DTaP/Tdap/Td (1 - Tdap) 03/12/1994   ??? Lipid Panel  03/13/1995   ??? Immunization: Influenza (1) 12/11/2017        Current Outpatient Medications:  Current Outpatient Medications   Medication Sig Dispense Refill   ??? triamcinolone (KENALOG) 0.1 % cream Apply topically 2 times a day. 45 g 0     No current facility-administered medications for this visit.           ROS:   Review of Systems           Objective:   There were no vitals filed for this visit.     Orthostatic Vital Signs:   There were no vitals filed for this visit.     There is no height or weight on file to calculate BMI.    Physical Exam           Assessment/Plan:     Ruben Alvarado is a 42 y.o. male with history of eczema who presents to establish care.    Chest Pain  Seen in ED on 01/30/18 for non-exertional chest pain, SOB, syncope and lower extremity swellin per ED documentation. He was admitted to ED observation unit where he had two negative troponins (17 hours apart), EKG showing sinus rhythm with no signs of ischemia,  normal TTE, CTPA negative for PE, normal duplex of right lower extremity and derm referral.     Eczema   Triamcinolone acetonide 0.025 - mild  Triamcinolone acetonide 0.1 - moderate  Make  appointment with dermatology           There are no diagnoses linked to this encounter.     No follow-ups on file.    Future Appointments   Date Time Provider Department Center   02/26/2018  1:40 PM Darrick Meigs, MD [facility]          [facility] Clois Dupes, MD

## 2018-06-29 ENCOUNTER — Inpatient Hospital Stay: Admit: 2018-06-29 | Discharge: 2018-06-29 | Disposition: A | Discharge status: Home / Self Care

## 2018-06-29 DIAGNOSIS — L309 Dermatitis, unspecified: Secondary | ICD-10-CM

## 2018-06-29 MED ORDER — acetaminophen (TYLENOL) tablet 975 mg
325 | Freq: Once | ORAL | Status: AC
Start: 2018-06-29 — End: 2018-06-29
  Administered 2018-06-29: 22:00:00 975 mg via ORAL

## 2018-06-29 MED ORDER — triamcinolone (KENALOG) 0.1 % lotion
0.1 | Freq: Once | TOPICAL | Status: AC
Start: 2018-06-29 — End: 2018-06-29
  Administered 2018-06-29: 22:00:00 via TOPICAL

## 2018-06-29 MED ORDER — triamcinolone (KENALOG) 0.1 % lotion
0.1 | Freq: Two times a day (BID) | TOPICAL | Status: AC
Start: 2018-06-29 — End: 2018-06-29

## 2018-06-29 MED ORDER — hydrOXYzine HCL (ATARAX) tablet 50 mg
25 | Freq: Once | ORAL | Status: AC
Start: 2018-06-29 — End: 2018-06-29
  Administered 2018-06-29: 22:00:00 50 mg via ORAL

## 2018-06-29 MED ORDER — hydrOXYzine HCL (ATARAX) 25 MG tablet
25 | ORAL_TABLET | ORAL | 0 refills | Status: AC | PRN
Start: 2018-06-29 — End: ?

## 2018-06-29 MED ORDER — triamcinolone (KENALOG) 0.1 % cream
0.1 | Freq: Two times a day (BID) | TOPICAL | 0 refills | 1.00000 days | Status: AC
Start: 2018-06-29 — End: 2018-06-29

## 2018-06-29 MED ORDER — ibuprofen (MOTRIN) tablet 800 mg
400 | Freq: Once | ORAL | Status: AC
Start: 2018-06-29 — End: 2018-06-29
  Administered 2018-06-29: 22:00:00 800 mg via ORAL

## 2018-06-29 MED ORDER — ibuprofen (MOTRIN) 600 MG tablet
600 | ORAL_TABLET | Freq: Four times a day (QID) | ORAL | 0 refills | Status: AC | PRN
Start: 2018-06-29 — End: ?

## 2018-06-29 MED ORDER — triamcinolone (KENALOG) 0.1 % cream
0.1 | Freq: Two times a day (BID) | TOPICAL | 1 refills | 1.00000 days | Status: AC
Start: 2018-06-29 — End: ?

## 2018-06-29 MED FILL — TYLENOL 325 MG TABLET: 325 325 mg | ORAL | Qty: 3

## 2018-06-29 MED FILL — IBUPROFEN 400 MG TABLET: 400 400 MG | ORAL | Qty: 2

## 2018-06-29 MED FILL — HYDROXYZINE HCL 25 MG TABLET: 25 25 MG | ORAL | Qty: 2

## 2018-06-29 MED FILL — TRIAMCINOLONE ACETONIDE 0.1 % LOTION: 0.1 0.1 % | TOPICAL | Qty: 60

## 2018-06-29 NOTE — Unmapped (Signed)
[facility] Emergency Department Note    Date of service:  06/29/2018    Reason for Visit: Skin Irritation      History of Present Illness     HPI:    Pravin Gatta is a 43 y.o. male with PMHx of severe eczema  presents with chief complaint of Skin Irritation      Pt presents with intense pruritus to the bilateral lower extremities.  He has been excoriating himself for several days, and states that he has such severe pruritus that feels like burning and aching.  This is consistent with his eczema, which has been worse over the past several days, though he states it has been ongoing for years.  Previously has followed with a dermatologist in [place], though he is now living in [place].  States that he previously was on Duprixent, had adverse reaction and can no longer take it.  States that he is no longer taking any specific oral or cream medications at this time.  He states that he has no other current symptoms, though he is tearful due to his incessant pruritus.    ROS:  Aside from the above HPI, patient denies any aggravating or alleviating factors or associated symptoms.     History     History reviewed. No pertinent past medical history.    History reviewed. No pertinent surgical history.    Geovani Christofferson  reports that he has been smoking cigarettes. He has a 7.50 pack-year smoking history. He has never used smokeless tobacco. He reports current alcohol use. He reports that he does not use drugs.    Previous Medications    TRIAMCINOLONE (KENALOG) 0.1 % CREAM    Apply topically 2 times a day.       Allergies:   Allergies as of 06/29/2018 - Fully Reviewed 06/29/2018   Allergen Reaction Noted   ??? Penicillins Anaphylaxis 12/18/2017   ??? Bactrim [sulfamethoxazole-trimethoprim] Other (See Comments) 12/18/2017       Physical Exam   Triage Vitals:  ED Triage Vitals [06/29/18 1726]   Vital Signs Group      Temp 98.4 ??F (36.9 ??C)      Temp Source Oral      Heart Rate  115      Heart Rate Source Monitor      Resp 20      SpO2 100 %      BP (!) 203/94      MAP (mmHg) 121      BP Location Right arm      BP Method Automatic      Patient Position Sitting   SpO2 100 %   O2 Device      Most Recent Vitals:  BP (!) 203/94 (BP Location: Right arm, Patient Position: Sitting)    Pulse 115    Temp 98.4 ??F (36.9 ??C) (Oral)    Resp 20    SpO2 100%     Physical Exam   Constitutional: He appears well-developed and well-nourished.   Mildly tearful, nontoxic man   HENT:   Head: Normocephalic and atraumatic.   Cardiovascular: Normal rate.   Pulmonary/Chest: Effort normal. No respiratory distress.   Neurological: He is oriented to person, place, time and situation.    Skin: Skin is warm and dry. He is not diaphoretic.   Skin over entire body is dry and flaky, arms and legs are particularly excoriated, with slight weeping abrasions, no warmth erythema or fluctuance to suggest infection   Psychiatric: He has  a normal mood and affect.         Diagnostic Studies     Labs:    Labs Reviewed - No data to display    Radiology:    No orders to display       EKG:    none    ED Course and MDM     Gerard Brue is a 43 y.o. male with a history and presentation as described above in HPI.  The patient was evaluated by myself and the ED Attending Physician. All management and disposition plans were discussed and agreed upon. Appropriate labs and diagnostic studies were reviewed as they were made available. Pertinent laboratory/imaging/other studies for medical decision making are listed above.            Is a gentleman with severe eczema presenting today with a flare of his eczema, possibly done due to recently cool weather.  Is in severe discomfort due to pruritus that feels like burning, and his skin is notably dry and flaky .  Excoriating his arms and legs to the point of bleeding.  No signs of cellulitis, erysipelas or other skin infection.    Currently on no medications, formerly on a biologic that he did  not tolerate.  Administered the patient Vistaril triamcinalone lotion, and ibuprofen on presentation.  Rx'ing same for home.  Is given on showering, then placing steroid cream on clean skin so that it will be absorbed.    Discussed all patient's hypertension with him, urged him to follow up with PCP for recheck to see if he has hypertension and will need medication.  Today's reading is most likely influenced by his extreme discomfort.    Dermatology referral, triamcinolone, atarax, and ibu given in ED and rx'd.    Consults:    None      Summary of Treatment in the ED     Medications:    Medications   acetaminophen (TYLENOL) tablet 975 mg (has no administration in time range)   ibuprofen (MOTRIN) tablet 800 mg (has no administration in time range)   triamcinolone (KENALOG) 0.1 % lotion (has no administration in time range)   hydrOXYzine HCL (ATARAX) tablet 50 mg (50 mg Oral Given 06/29/18 1749)       Procedures:    Procedures      Impression     1. Eczema, unspecified type           Disposition     Risks, benefits, and alternatives were discussed. At this time the patient has been deemed safe for discharge. My customary discharge instructions including strict return precautions for worsening or new symptoms have been communicated.        Glendale Chard, MD  Resident  06/29/18 [phone]

## 2018-06-29 NOTE — Unmapped (Signed)
Date of Service: ?? 06/29/2018    The patient was seen and examined with the resident physician.  I have reviewed the notes, assessments, plan and/or procedures.  The case was discussed with the resident physician and I agree with their documentation.    Assessment reveals diffuse rash consistent with chronic eczema.  There is multiple excoriations and no evidence of cellulitis.[name]

## 2018-06-29 NOTE — Unmapped (Signed)
Patient states he is here for his exemia flair up.

## 2018-06-29 NOTE — Unmapped (Signed)
Pt presents to ED with c/o skin irritation d/t his eczema flair up since yesterday and worsen today.

## 2018-06-29 NOTE — Unmapped (Addendum)
Dear Ruben Alvarado,    You were seen for Skin Irritation  .    Your workup today revealed:    You appear to be having an eczema flare.  We provided you with a pill to help you with your itching, as well as a steroid cream, and ibuprofen.  I have also written a prescription for these medications.  It is important that you establish care with a dermatologist here in [place], I am providing a referral.    Please shower before putting on the steroid cream, and immediately put on after shower.    A new Dermatology clinic has opened, which should make it easier to get an appointment. Call [phone] at your earliest convenience.      _________________________    Please follow-up with the dermatologist.     If you do not have a primary care physician please contact your insurance provider to establish care with a new physician or call [phone] to establish care here, with [facility] located at [address].    _________________________    Return to the Emergency Department if:     - You develop a fever (greater than 101 F)   - You have chest pain, shortness of breath, abdominal pain, or uncontrollable vomiting  - You are unable to eat or drink  - You pass out  - You have difficulty moving your arms or legs    - You have new numbness  - You have difficulty speaking or slurred speech  - You have an injury you believe requires medical attention  - Or you have any other concerns      It was a pleasure to take care of you.

## 2020-05-25 ENCOUNTER — Emergency Department: Payer: BLUE CROSS/BLUE SHIELD

## 2020-05-25 ENCOUNTER — Inpatient Hospital Stay
Admit: 2020-05-25 | Discharge: 2020-05-25 | Discharge status: Against Medical Advice | Payer: BLUE CROSS/BLUE SHIELD | Attending: Emergency Medicine

## 2020-05-25 DIAGNOSIS — L0100 Impetigo, unspecified: Secondary | ICD-10-CM

## 2020-05-25 LAB — CBC WITH AUTO DIFFERENTIAL
Basophils %: 0.3 % (ref 0.0–2.0)
Basophils Absolute: 0.03 E9/L (ref 0.00–0.20)
Eosinophils %: 5.1 % (ref 0.0–6.0)
Eosinophils Absolute: 0.51 E9/L — ABNORMAL HIGH (ref 0.05–0.50)
Hematocrit: 44.6 % (ref 37.0–54.0)
Hemoglobin: 15 g/dL (ref 12.5–16.5)
Immature Granulocytes #: 0.04 E9/L
Immature Granulocytes %: 0.4 % (ref 0.0–5.0)
Lymphocytes %: 17.4 % — ABNORMAL LOW (ref 20.0–42.0)
Lymphocytes Absolute: 1.75 E9/L (ref 1.50–4.00)
MCH: 30.1 pg (ref 26.0–35.0)
MCHC: 33.6 % (ref 32.0–34.5)
MCV: 89.6 fL (ref 80.0–99.9)
MPV: 9.7 fL (ref 7.0–12.0)
Monocytes %: 8.9 % (ref 2.0–12.0)
Monocytes Absolute: 0.9 E9/L (ref 0.10–0.95)
Neutrophils %: 67.9 % (ref 43.0–80.0)
Neutrophils Absolute: 6.85 E9/L (ref 1.80–7.30)
Platelets: 318 E9/L (ref 130–450)
RBC: 4.98 E12/L (ref 3.80–5.80)
RDW: 13.4 fL (ref 11.5–15.0)
WBC: 10.1 E9/L (ref 4.5–11.5)

## 2020-05-25 LAB — COMPREHENSIVE METABOLIC PANEL
ALT: 12 U/L (ref 0–40)
AST: 19 U/L (ref 0–39)
Albumin: 4.1 g/dL (ref 3.5–5.2)
Alkaline Phosphatase: 89 U/L (ref 40–129)
Anion Gap: 11 mmol/L (ref 7–16)
BUN: 6 mg/dL (ref 6–20)
CO2: 24 mmol/L (ref 22–29)
Calcium: 8.9 mg/dL (ref 8.6–10.2)
Chloride: 107 mmol/L (ref 98–107)
Creatinine: 1.2 mg/dL (ref 0.7–1.2)
GFR African American: >60
GFR Non-African American: >60 mL/min/{1.73_m2} (ref >=60)
Glucose: 99 mg/dL (ref 74–99)
Potassium: 4.4 mmol/L (ref 3.5–5.0)
Sodium: 142 mmol/L (ref 132–146)
Total Bilirubin: 0.4 mg/dL (ref 0.0–1.2)
Total Protein: 7.4 g/dL (ref 6.4–8.3)

## 2020-05-25 LAB — URINALYSIS WITH MICROSCOPIC
Bacteria, UA: NONE SEEN /HPF
Bilirubin Urine: NEGATIVE
Blood, Urine: NEGATIVE
Glucose, Ur: NEGATIVE mg/dL
Ketones, Urine: NEGATIVE mg/dL
Leukocyte Esterase, Urine: NEGATIVE
Nitrite, Urine: NEGATIVE
Specific Gravity, UA: >=1.03 (ref 1.005–1.030)
Urobilinogen, Urine: 2 E.U./dL — AB (ref <2.0)
pH, UA: 6 (ref 5.0–9.0)

## 2020-05-25 LAB — CK: Total CK: 77 U/L (ref 20–200)

## 2020-05-25 MED ORDER — SODIUM CHLORIDE 0.9 % IV BOLUS
0.9 % | Freq: Once | INTRAVENOUS | Status: AC
Start: 2020-05-25 — End: 2020-05-25
  Administered 2020-05-25: 16:00:00 1000 mL via INTRAVENOUS

## 2020-05-25 MED ORDER — MUPIROCIN 2 % EX OINT
2 % | CUTANEOUS | 0 refills | Status: AC
Start: 2020-05-25 — End: 2020-06-01

## 2020-05-25 MED ORDER — TRIAMCINOLONE ACETONIDE 40 MG/ML IJ SUSP
40 MG/ML | Freq: Once | INTRAMUSCULAR | Status: AC
Start: 2020-05-25 — End: 2020-05-25
  Administered 2020-05-25: 16:00:00 via INTRAMUSCULAR

## 2020-05-25 MED ORDER — PREDNISONE 20 MG PO TABS
20 MG | ORAL_TABLET | Freq: Two times a day (BID) | ORAL | 0 refills | Status: AC
Start: 2020-05-25 — End: 2020-05-30

## 2020-05-25 MED ORDER — DOXYCYCLINE HYCLATE 100 MG PO TABS
100 MG | ORAL_TABLET | Freq: Two times a day (BID) | ORAL | 0 refills | Status: AC
Start: 2020-05-25 — End: 2020-06-04

## 2020-05-25 MED ORDER — KETOROLAC TROMETHAMINE 30 MG/ML IJ SOLN
30 MG/ML | Freq: Once | INTRAMUSCULAR | Status: AC
Start: 2020-05-25 — End: 2020-05-25
  Administered 2020-05-25: 16:00:00 30 mg via INTRAVENOUS

## 2020-05-25 MED ORDER — IBUPROFEN 800 MG PO TABS
800 MG | ORAL_TABLET | Freq: Three times a day (TID) | ORAL | 0 refills | Status: AC | PRN
Start: 2020-05-25 — End: ?

## 2020-05-25 MED ORDER — EUCERIN EX CREA
CUTANEOUS | 0 refills | Status: AC
Start: 2020-05-25 — End: ?

## 2020-05-25 MED FILL — KETOROLAC TROMETHAMINE 30 MG/ML IJ SOLN: 30 mg/mL | INTRAMUSCULAR | Qty: 1

## 2020-05-25 MED FILL — KENALOG 40 MG/ML IJ SUSP: 40 mg/mL | INTRAMUSCULAR | Qty: 1

## 2020-05-25 NOTE — ED Notes (Signed)
PT refused [place] at this time. Dr. Marlene Bast aware.      Gevena Cotton, RN  05/25/20 1224

## 2020-05-25 NOTE — ED Notes (Signed)
PT to [place] at this time     Gevena Cotton, RN  05/25/20 1216

## 2020-05-25 NOTE — Discharge Instructions (Signed)
Leaving Against Medical Advice    Discharge against medical advice means that you choose to leave the hospital before your caregiver recommends that you do. Your caregiver may still need to diagnose or treat your condition or your treatment may not be complete.    INSTRUCTIONS: contact your doctor or the provider assigned on your instructions as soon as possible to arrange follow-up.     Risks:  Risks of leaving the hospital before your caregivers recommend include the following:        Your condition may cause other health problems if not treated properly including disability or death..        You may need to be admitted to the hospital again for the same condition.        Your condition could become life-threatening.

## 2020-05-25 NOTE — Other (Signed)
Pt called and was notified of his results.  Pt was instructed to go to his closest emergency room to be admitted and get IV antibiotics.

## 2020-05-25 NOTE — ED Provider Notes (Addendum)
HPI:  05/25/20, Time: 11:48 AM EDT         Larry Tyler is a 45 y.o. male presenting to the ED for eczema flare, beginning past several days-weeks.  Patient states that he has chronic eczema and usually gets muscle aches and chills with it but is now having such an eczema flare that he has drainage from the skin on his scalp behind his ear.  He states he normally gets a shot of Kenalog and pain pills to help with this.  He is supposed to use Aquaphor cream but does not use it because he says it does not help and the eczema is everywhere.  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  He states that his legs normally normally hurt with his flare but that may be he had been overexerting himself getting in and out of the truck after the radiator broke last week.  He denies fever, cough, congestion, dysuria, hematuria, abdominal pain, nausea vomiting or diarrhea.  He is driving and has a 50 year old son in the truck with him.  The complaint has been persistent, moderate in severity, and worsened by nothing.  Patient denies fever, sore throat, cough, congestion, chest pain, shortness of breath, edema, headache, visual disturbances, focal paresthesias, focal weakness, abdominal pain, nausea, vomiting, diarrhea, constipation, dysuria, hematuria, trauma, neck or back pain, rash or other complaints.         ROS:   A complete review of systems was performed and all pertinent positives and negatives are stated within HPI, all other systems reviewed and are negative.      --------------------------------------------- PAST HISTORY ---------------------------------------------  Past Medical History:  has no past medical history on file.    Past Surgical History:  has no past surgical history on file.    Social History:  reports that he has been smoking cigarettes. He has a 26.00 pack-year smoking history. He has never used smokeless tobacco. He reports that he does not use drugs.    Family History: family history  is not on file.     The patient???s home medications have been reviewed.    Allergies: Pcn [penicillins] and Bactrim [sulfamethoxazole-trimethoprim]        ----------------------------------------PHYSICAL EXAM--------------------------------------  Constitutional:  Well developed, well nourished, no acute distress, non-toxic appearance   Eyes:  PERRL, conjunctiva normal, EOMI  HENT:  Atraumatic, external ears normal, nose normal, oropharynx moist, no pharyngeal exudates. Neck- normal range of motion, no nuchal rigidity   Respiratory:  No respiratory distress, normal breath sounds, no rales, no wheezing   Cardiovascular:  Normal rate, normal rhythm, no murmurs, no gallops, no rubs. Radial and DP pulses 2+ bilaterally.  Compartments are soft.  He is warm and well perfused.  Cap refill less than 3 seconds.  GI:  Soft, nondistended, normal bowel sounds, nontender, no organomegaly, no mass, no rebound, no guarding   GU:  No costovertebral angle tenderness   Musculoskeletal:  2+ bilateral lower extremity non-pitting edema, no tenderness, no deformities. Back- no tenderness  Integument:  Well hydrated. Adequate perfusion.  Diffuse dry skin/eczematous rash all over face, neck, scalp, arms, legs and upper torso.  There is some clear weeping noted around the pinna of the left ear and behind ear on scalp.  Lymphatic:  No cervical lymphadenopathy noted   Neurologic:  Alert & oriented x 3, CN 2-12 normal, no focal deficits noted. DTRs 2+ bilateral patellar. Normal gait.    Psychiatric:  Speech and behavior appropriate       --------------------------------------------------  RESULTS -------------------------------------------------  I have personally reviewed all laboratory and imaging results for this patient. Results are listed below.     LABS:  Results for orders placed or performed during the hospital encounter of 05/25/20   CBC with Auto Differential   Result Value Ref Range    WBC 10.1 4.5 - 11.5 E9/L    RBC 4.98 3.80 - 5.80  E12/L    Hemoglobin 15.0 12.5 - 16.5 g/dL    Hematocrit 77.8 24.2 - 54.0 %    MCV 89.6 80.0 - 99.9 fL    MCH 30.1 26.0 - 35.0 pg    MCHC 33.6 32.0 - 34.5 %    RDW 13.4 11.5 - 15.0 fL    Platelets 318 130 - 450 E9/L    MPV 9.7 7.0 - 12.0 fL    Neutrophils % 67.9 43.0 - 80.0 %    Immature Granulocytes % 0.4 0.0 - 5.0 %    Lymphocytes % 17.4 (L) 20.0 - 42.0 %    Monocytes % 8.9 2.0 - 12.0 %    Eosinophils % 5.1 0.0 - 6.0 %    Basophils % 0.3 0.0 - 2.0 %    Neutrophils Absolute 6.85 1.80 - 7.30 E9/L    Immature Granulocytes # 0.04 E9/L    Lymphocytes Absolute 1.75 1.50 - 4.00 E9/L    Monocytes Absolute 0.90 0.10 - 0.95 E9/L    Eosinophils Absolute 0.51 (H) 0.05 - 0.50 E9/L    Basophils Absolute 0.03 0.00 - 0.20 E9/L   Comprehensive Metabolic Panel   Result Value Ref Range    Sodium 142 132 - 146 mmol/L    Potassium 4.4 3.5 - 5.0 mmol/L    Chloride 107 98 - 107 mmol/L    CO2 24 22 - 29 mmol/L    Anion Gap 11 7 - 16 mmol/L    Glucose 99 74 - 99 mg/dL    BUN 6 6 - 20 mg/dL    CREATININE 1.2 0.7 - 1.2 mg/dL    GFR Non-African American >60 >=60 mL/min/1.73    GFR African American >60     Calcium 8.9 8.6 - 10.2 mg/dL    Total Protein 7.4 6.4 - 8.3 g/dL    Albumin 4.1 3.5 - 5.2 g/dL    Total Bilirubin 0.4 0.0 - 1.2 mg/dL    Alkaline Phosphatase 89 40 - 129 U/L    ALT 12 0 - 40 U/L    AST 19 0 - 39 U/L   CK   Result Value Ref Range    Total CK 77 20 - 200 U/L   Urinalysis with Microscopic   Result Value Ref Range    Color, UA Yellow Straw/Yellow    Clarity, UA Clear Clear    Glucose, Ur Negative Negative mg/dL    Bilirubin Urine Negative Negative    Ketones, Urine Negative Negative mg/dL    Specific Gravity, UA >=1.030 1.005 - 1.030    Blood, Urine Negative Negative    pH, UA 6.0 5.0 - 9.0    Protein, UA TRACE Negative mg/dL    Urobilinogen, Urine 2.0 (A) <2.0 E.U./dL    Nitrite, Urine Negative Negative    Leukocyte Esterase, Urine Negative Negative    WBC, UA NONE 0 - 5 /HPF    RBC, UA NONE 0 - 2 /HPF    Bacteria, UA NONE SEEN  None Seen /HPF       RADIOLOGY:  Interpreted by Radiologist.  XR CHEST PORTABLE    (  Results Pending)     ------------------------ NURSING NOTES AND VITALS REVIEWED ---------------------------  The nursing notes within the ED encounter and vital signs as below have been reviewed by myself.    BP (!) 154/98    Pulse 97    Temp 97 ??F (36.1 ??C)    Resp 16    Ht 5\' 11"  (1.803 m)    Wt 210 lb (95.3 kg)    SpO2 94%    BMI 29.29 kg/m??   Oxygen Saturation Interpretation: Normal      The patient???s available past medical records and past encounters were reviewed.        ------------------------------ ED COURSE/MEDICAL DECISION MAKING----------------------  Medications   0.9 % sodium chloride bolus (0 mLs IntraVENous Stopped 05/25/20 1309)   ketorolac (TORADOL) injection 30 mg (30 mg IntraVENous Given 05/25/20 1210)   triamcinolone acetonide (KENALOG-40) injection 40 mg (40 mg IntraMUSCular Given 05/25/20 1210)           Procedures:   none      Medical Decision Making:    Work-up negative acute on blood work.  Patient declined chest x-ray stating he does not have pneumonia or an infection in his respiratory tract.  He also declined ultrasounds of his legs to rule out DVT.  He is adamant that he does not have a blood clot and that he always feels this way with chills and leg pains and cramps when he gets an eczema flare.  He will sign out AMA.  He will be treated with steroids for his eczema flare and provided a prescription for Eucerin cream because he does not like the Aquaphor.  It appears that he is getting some impetigo on the top of his left ear and behind his left ear on scalp with the drainage, will add Bactroban cream and doxycycline as he is allergic to several antibiotics and Keflex is not an option at this time.  We will also add Motrin for pain.   This patient has chosen to leave against medical advice.  I have personally explained to them that choosing to do so may result in permanent bodily harm or death.  I  discussed at length that without further evaluation and monitoring there may be unforeseen circumstances and deterioration resulting in permanent bodily harm or death as a result of their choice.    They are alert, oriented, and competent at this time.  They state that they are aware of the serious risks as explained, but they continue to wish to leave against medical advice.    In light of their decision to leave against medical advice, follow-up has been discussed and they are aware of the importance of following up as instructed.  They have been advised that they should return to the ED immediately if they change their mind at any time, or if their condition begins to change or worsen.         This patient's ED course included: re-evaluation prior to disposition, IV medications and a personal history and physicial eaxmination    This patient has remained hemodynamically stable, improved and been closely monitored during their ED course.    Re-Evaluations:  Time: 13:00 PM EST   Re-evaluation.  Patient???s symptoms are improving  Repeat physical examination is not changed      Consultations:   none    Critical Care: none    I, Pleas KochAmanda Tyreka Henneke, DO, am the Primary Provider of Record    Counseling:  The emergency provider has  spoken with the patient and discussed today???s results, in addition to providing specific details for the plan of care and counseling regarding the diagnosis and prognosis.  Questions are answered at this time and they are agreeable with the plan.    --------------------------- IMPRESSION AND DISPOSITION ---------------------------------    IMPRESSION  1. Eczema, unspecified type    2. Impetigo    3. Pain in both lower extremities        DISPOSITION  Disposition: Left AMA  Patient condition is stable              Broadus John, DO  05/25/20 1326       Broadus John, DO  05/25/20 1329

## 2020-05-25 NOTE — ED Notes (Signed)
Blood cultures results reviewed, positive for MRSA. RN Lance Bosch was advised to call patient and advise to return to the ER for admission for antibiotics IV, or proceed to nearest ER for hospital admission to treat his MRSA bacteremia. He was discharged on doxycycline (which MRSA should be sensitive to).      Larry John, DO  05/28/20 [phone]

## 2020-05-26 LAB — BLOOD ID PANEL, MOLECULAR
Acinetobacter calcoac baumannii complex by PCR: NOT DETECTED
Bacteroides fragilis by PCR: NOT DETECTED
Candida albicans by PCR: NOT DETECTED
Candida auris by PCR: NOT DETECTED
Candida glabrata by PCR: NOT DETECTED
Candida krusei by PCR: NOT DETECTED
Candida parapsilosis by PCR: NOT DETECTED
Candida tropicalis by PCR: NOT DETECTED
Cryptococcus neoformans/gattii by PCR: NOT DETECTED
Enterobacter cloacae complex by PCR: NOT DETECTED
Enterobacteriaceae by PCR: NOT DETECTED
Enterococcus faecalis by PCR: NOT DETECTED
Enterococcus faecium by PCR: NOT DETECTED
Escherichia coli by PCR: NOT DETECTED
Haemophilus Influenzae by PCR: NOT DETECTED
Klebsiella aerogenes by PCR: NOT DETECTED
Klebsiella oxytoca by PCR: NOT DETECTED
Klebsiella pneumoniae group by PCR: NOT DETECTED
Listeria monocytogenes by PCR: NOT DETECTED
Methicillin Resistance mecA/C and MREJ by PCR: DETECTED — CR
Neisseria meningitidis by PCR: NOT DETECTED
Proteus species by PCR: NOT DETECTED
Pseudomonas aeruginosa by PCR: NOT DETECTED
Salmonella species by PCR: NOT DETECTED
Serratia marcescens by PCR: NOT DETECTED
Staphylococcus aureus by PCR: DETECTED — CR
Staphylococcus epidermidis by PCR: NOT DETECTED
Staphylococcus lugdunensis by PCR: NOT DETECTED
Staphylococcus species by PCR: DETECTED — CR
Stenotrophomonas maltophilia by PCR: NOT DETECTED
Streptococcus agalactiae by PCR: NOT DETECTED
Streptococcus pneumoniae by PCR: NOT DETECTED
Streptococcus pyogenes  by PCR: NOT DETECTED
Streptococcus species by PCR: NOT DETECTED

## 2020-05-28 LAB — CULTURE, URINE: Urine Culture, Routine: <10000

## 2020-05-30 LAB — CULTURE, BLOOD 1: Blood Culture, Routine: 5

## 2020-05-31 LAB — CULTURE, BLOOD 2

## 2020-07-20 ENCOUNTER — Emergency Department: Admit: 2020-07-20

## 2020-07-20 ENCOUNTER — Inpatient Hospital Stay
Admit: 2020-07-20 | Discharge: 2020-07-20 | Disposition: A | Discharge status: Home / Self Care | Attending: Emergency Medicine

## 2020-07-20 DIAGNOSIS — R519 Headache, unspecified: Secondary | ICD-10-CM

## 2020-07-20 LAB — BASIC METABOLIC PANEL
Anion Gap: 11 mmol/L (ref 9–17)
BUN: 7 mg/dL (ref 6–20)
CO2: 26 mmol/L (ref 20–31)
Calcium: 9.2 mg/dL (ref 8.6–10.4)
Chloride: 101 mmol/L (ref 98–107)
Creatinine: 0.88 mg/dL (ref 0.70–1.20)
GFR African American: >60 mL/min (ref >60)
GFR Non-African American: >60 mL/min (ref >60)
Glucose: 96 mg/dL (ref 70–99)
Potassium: 3.5 mmol/L — ABNORMAL LOW (ref 3.7–5.3)
Sodium: 138 mmol/L (ref 135–144)

## 2020-07-20 LAB — COVID-19, RAPID: SARS-CoV-2, Rapid: NOT DETECTED

## 2020-07-20 LAB — CBC WITH AUTO DIFFERENTIAL
Absolute Eos #: 0 10*3/uL (ref 0.00–0.44)
Absolute Immature Granulocyte: 0.15 10*3/uL (ref 0.00–0.30)
Absolute Lymph #: 2.75 10*3/uL (ref 1.10–3.70)
Absolute Mono #: 1.53 10*3/uL — ABNORMAL HIGH (ref 0.10–1.20)
Basophils Absolute: 0 10*3/uL (ref 0.00–0.20)
Basophils: 0 % (ref 0–2)
Eosinophils %: 0 % — ABNORMAL LOW (ref 1–4)
Hematocrit: 43.1 % (ref 40.7–50.3)
Hemoglobin: 14.3 g/dL (ref 13.0–17.0)
Immature Granulocytes: 1 % — ABNORMAL HIGH
Lymphocytes: 18 % — ABNORMAL LOW (ref 24–43)
MCH: 30.5 pg (ref 25.2–33.5)
MCHC: 33.2 g/dL (ref 28.4–34.8)
MCV: 91.9 fL (ref 82.6–102.9)
MPV: 9.6 fL (ref 8.1–13.5)
Monocytes: 10 % (ref 3–12)
Morphology: NORMAL
NRBC Automated: 0.1 per 100 WBC — ABNORMAL HIGH
Platelets: 310 10*3/uL (ref 138–453)
RBC: 4.69 m/uL (ref 4.21–5.77)
RDW: 13.7 % (ref 11.8–14.4)
Seg Neutrophils: 71 % — ABNORMAL HIGH (ref 36–65)
Segs Absolute: 10.87 10*3/uL — ABNORMAL HIGH (ref 1.50–8.10)
WBC: 15.3 10*3/uL — ABNORMAL HIGH (ref 3.5–11.3)

## 2020-07-20 LAB — TROPONIN
Troponin, High Sensitivity: 8 ng/L (ref 0–22)
Troponin, High Sensitivity: 8 ng/L (ref 0–22)

## 2020-07-20 LAB — BRAIN NATRIURETIC PEPTIDE: Pro-BNP: 102 pg/mL (ref <300)

## 2020-07-20 MED ORDER — POTASSIUM BICARB-CITRIC ACID 20 MEQ PO TBEF
20 MEQ | Freq: Once | ORAL | Status: AC
Start: 2020-07-20 — End: 2020-07-20
  Administered 2020-07-20: 20:00:00 via ORAL

## 2020-07-20 MED ORDER — LABETALOL HCL 5 MG/ML IV SOLN
5 MG/ML | Freq: Once | INTRAVENOUS | Status: AC
Start: 2020-07-20 — End: 2020-07-20
  Administered 2020-07-20: 19:00:00 via INTRAVENOUS

## 2020-07-20 MED FILL — LABETALOL HCL 5 MG/ML IV SOLN: 5 mg/mL | INTRAVENOUS | Qty: 4

## 2020-07-20 MED FILL — EFFER-K 20 MEQ PO TBEF: 20 meq | ORAL | Qty: 2

## 2020-07-20 NOTE — ED Provider Notes (Signed)
[facility] ED  Emergency Department Encounter  EmergencyMedicine Resident     Pt Name:Larry Tyler  MRN: [id number]  Birthdate 12-20-1975  Date of evaluation: 07/20/20  PCP:  No primary care provider on file.    This patient was evaluated in the Emergency Department for symptoms described in the history of present illness. The patient was evaluated in the context of the global COVID-19 pandemic, which necessitated consideration that the patient might be at risk for infection with the SARS-CoV-2 virus that causes COVID-19. Institutional protocols and algorithms that pertain to the evaluation of patients at risk for COVID-19 are in a state of rapid change based on information released by regulatory bodies including the CDC and federal and state organizations. These policies and algorithms were followed during the patient's care in the ED.    CHIEF COMPLAINT       Chief Complaint   Patient presents with   ??? Headache   ??? Chest Pain     HISTORY OF PRESENT ILLNESS  (Location/Symptom, Timing/Onset, Context/Setting, Quality, Duration, Modifying Factors, Severity.)      Larry Tyler is a 45 y.o. male who presents with 2 to 3-day of feeling generalized unwell, states that over the last 2 to 3 hours, his headache and chest pain has gotten worse.  Patient denies known history of hypertension, does not take any daily medication.  Denies nausea, vomiting, abdominal pain, fevers, chills, shortness of breath, cough.  Patient has been seen recently in the emergency department for dermatitis related complaints.    PAST MEDICAL / SURGICAL / SOCIAL / FAMILY HISTORY      has no past medical history on file.     has no past surgical history on file.    Social History     Socioeconomic History   ??? Marital status: Single     Spouse name: Not on file   ??? Number of children: Not on file   ??? Years of education: Not on file   ??? Highest education level: Not on file   Occupational History   ??? Not on file   Tobacco Use   ???  Smoking status: Current Every Day Smoker     Packs/day: 2.00     Years: 13.00     Pack years: 26.00     Types: Cigarettes   ??? Smokeless tobacco: Never Used   Substance and Sexual Activity   ??? Alcohol use: Not on file   ??? Drug use: Never   ??? Sexual activity: Not on file   Other Topics Concern   ??? Not on file   Social History Narrative   ??? Not on file     Social Determinants of Health     Financial Resource Strain:    ??? Difficulty of Paying Living Expenses: Not on file   Food Insecurity:    ??? Worried About Running Out of Food in the Last Year: Not on file   ??? Ran Out of Food in the Last Year: Not on file   Transportation Needs:    ??? Lack of Transportation (Medical): Not on file   ??? Lack of Transportation (Non-Medical): Not on file   Physical Activity:    ??? Days of Exercise per Week: Not on file   ??? Minutes of Exercise per Session: Not on file   Stress:    ??? Feeling of Stress : Not on file   Social Connections:    ??? Frequency of Communication with Friends and Family: Not  on file   ??? Frequency of Social Gatherings with Friends and Family: Not on file   ??? Attends Religious Services: Not on file   ??? Active Member of Clubs or Organizations: Not on file   ??? Attends Banker Meetings: Not on file   ??? Marital Status: Not on file   Intimate Partner Violence:    ??? Fear of Current or Ex-Partner: Not on file   ??? Emotionally Abused: Not on file   ??? Physically Abused: Not on file   ??? Sexually Abused: Not on file   Housing Stability:    ??? Unable to Pay for Housing in the Last Year: Not on file   ??? Number of Places Lived in the Last Year: Not on file   ??? Unstable Housing in the Last Year: Not on file       History reviewed. No pertinent family history.    Allergies:  Pcn [penicillins] and Bactrim [sulfamethoxazole-trimethoprim]    Home Medications:  Prior to Admission medications    Medication Sig Start Date End Date Taking? Authorizing Provider   ibuprofen (ADVIL;MOTRIN) 800 MG tablet Take 1 tablet by mouth every 8 hours  as needed for Pain 05/25/20   Broadus John, DO   Skin Protectants, Misc. (EUCERIN) cream Apply topically as needed. 05/25/20   Broadus John, DO       REVIEW OF SYSTEMS    (2-9 systems for level 4, 10 or more for level 5)      Review of Systems   Constitutional: Negative for chills and fever.   HENT: Negative for sore throat.    Eyes: Negative for visual disturbance.   Respiratory: Negative for cough and shortness of breath.    Cardiovascular: Positive for chest pain. Negative for palpitations.   Gastrointestinal: Negative for abdominal pain and vomiting.   Endocrine: Negative for polyuria.   Genitourinary: Negative for dysuria and hematuria.   Musculoskeletal: Negative for back pain.   Skin: Negative for rash.   Neurological: Positive for headaches. Negative for light-headedness.   Psychiatric/Behavioral: Negative for confusion.       PHYSICAL EXAM   (up to 7 for level 4, 8 or more for level 5)      INITIAL VITALS:   BP (!) 168/93    Pulse 90    Temp 99.4 ??F (37.4 ??C) (Oral)    Resp 18    Wt 210 lb (95.3 kg)    SpO2 95%    BMI 29.29 kg/m??     Physical Exam  Constitutional:       Appearance: Normal appearance. He is normal weight.   HENT:      Head: Normocephalic.      Mouth/Throat:      Mouth: Mucous membranes are moist.      Pharynx: Oropharynx is clear.   Eyes:      Extraocular Movements: Extraocular movements intact.      Pupils: Pupils are equal, round, and reactive to light.   Cardiovascular:      Rate and Rhythm: Regular rhythm. Tachycardia present.      Pulses: Normal pulses.      Heart sounds: Normal heart sounds.   Pulmonary:      Effort: Pulmonary effort is normal.      Breath sounds: Normal breath sounds.   Abdominal:      Palpations: Abdomen is soft.      Tenderness: There is no abdominal tenderness. There is no right CVA tenderness or left  CVA tenderness.   Musculoskeletal:      Cervical back: Neck supple. No tenderness.      Right lower leg: No edema.      Left lower leg: No edema.   Skin:      General: Skin is warm.      Capillary Refill: Capillary refill takes less than 2 seconds.   Neurological:      General: No focal deficit present.      Mental Status: He is alert and oriented to person, place, and time. Mental status is at baseline.   Psychiatric:         Mood and Affect: Mood normal.       DIFFERENTIAL  DIAGNOSIS     PLAN (LABS / IMAGING / EKG):  Orders Placed This Encounter   Procedures   ??? COVID-19, Rapid   ??? CT HEAD WO CONTRAST   ??? XR CHEST PORTABLE   ??? CBC with Auto Differential   ??? Basic Metabolic Panel   ??? Troponin   ??? Brain Natriuretic Peptide   ??? EKG 12 Lead       MEDICATIONS ORDERED:  Orders Placed This Encounter   Medications   ??? labetalol (NORMODYNE;TRANDATE) injection 10 mg   ??? potassium bicarb-citric acid (EFFER-K) effervescent tablet 40 mEq     DDX: Intracranial hemorrhage, SAH, hypertensive urgency, essential hypertension, ACS, angina, unstable angina, dissection, pneumonia, COVID, flu    DIAGNOSTIC RESULTS / EMERGENCY DEPARTMENT COURSE / MDM   LAB RESULTS:  Results for orders placed or performed during the hospital encounter of 07/20/20   CBC with Auto Differential   Result Value Ref Range    WBC 15.3 (H) 3.5 - 11.3 k/uL    RBC 4.69 4.21 - 5.77 m/uL    Hemoglobin 14.3 13.0 - 17.0 g/dL    Hematocrit 16.143.1 09.640.7 - 50.3 %    MCV 91.9 82.6 - 102.9 fL    MCH 30.5 25.2 - 33.5 pg    MCHC 33.2 28.4 - 34.8 g/dL    RDW 04.513.7 40.911.8 - 81.114.4 %    Platelets 310 138 - 453 k/uL    MPV 9.6 8.1 - 13.5 fL    NRBC Automated 0.1 (H) 0.0 per 100 WBC    Immature Granulocytes 1 (H) 0 %    Seg Neutrophils 71 (H) 36 - 65 %    Lymphocytes 18 (L) 24 - 43 %    Monocytes 10 3 - 12 %    Eosinophils % 0 (L) 1 - 4 %    Basophils 0 0 - 2 %    Absolute Immature Granulocyte 0.15 0.00 - 0.30 k/uL    Segs Absolute 10.87 (H) 1.50 - 8.10 k/uL    Absolute Lymph # 2.75 1.10 - 3.70 k/uL    Absolute Mono # 1.53 (H) 0.10 - 1.20 k/uL    Absolute Eos # 0.00 0.00 - 0.44 k/uL    Basophils Absolute 0.00 0.00 - 0.20 k/uL    Morphology  Normal    Basic Metabolic Panel   Result Value Ref Range    Glucose 96 70 - 99 mg/dL    BUN 7 6 - 20 mg/dL    CREATININE 9.140.88 7.820.70 - 1.20 mg/dL    Calcium 9.2 8.6 - 95.610.4 mg/dL    Sodium 213138 086135 - 578144 mmol/L    Potassium 3.5 (L) 3.7 - 5.3 mmol/L    Chloride 101 98 - 107 mmol/L    CO2 26 20 - 31 mmol/L  Anion Gap 11 9 - 17 mmol/L    GFR Non-African American >60 >60 mL/min    GFR African American >60 >60 mL/min    GFR Comment         Troponin   Result Value Ref Range    Troponin, High Sensitivity 8 0 - 22 ng/L   Troponin   Result Value Ref Range    Troponin, High Sensitivity 8 0 - 22 ng/L   Brain Natriuretic Peptide   Result Value Ref Range    Pro-BNP 102 <300 pg/mL       IMPRESSION: 45 year old gentleman presents to the emergency department for multiple day history of headache, chest pain that is worsening in the last 2 hours.  Patient has a blood pressure of 199/105, no history of hypertension and does not take any home meds.  Labetalol 10 mg given once.  Labs showing some leukocytosis, otherwise no apparent source.  Chest x-ray nonspecific interstitial scattered opacities, suggesting viral infection.  COVID negative.  Discussed with patient with regards to follow-up with primary care physician and for strict return precautions.  Patient verbalized agreement understanding.  Stable for discharge.    RADIOLOGY:  See radiology report    EMERGENCY DEPARTMENT COURSE:  ED Course as of 07/20/20 1718   Tue Jul 20, 2020   1444 WBC(!): 15.3 [EM]   1445 CXR  Mild scattered interstitial opacities.  This is nonspecific but can be seen  with interstitial edema as well as atypical/viral infection. [EM]   1451 CTH neg [EM]      ED Course User Index  [EM] Taegen Lennox Marisa Severin, MD        No notes of EC Admission Criteria type on file.    PROCEDURES:  None    CONSULTS:  None    FINAL IMPRESSION      1. Intractable headache, unspecified chronicity pattern, unspecified headache type    2. Chest pain, unspecified type    3. Hypertension,  unspecified type          DISPOSITION / PLAN     DISPOSITION        PATIENT REFERRED TO:  [facility]  [address]  [place] [address]  [phone]  Schedule an appointment as soon as possible for a visit   For follow up    [facility]  [address]  [place] [address]  [phone]  Go to   As needed      DISCHARGE MEDICATIONS:  New Prescriptions    No medications on file       Mykenna Viele Marisa Severin, MD  Emergency Medicine Resident    (Please note that portions of thisnote were completed with a voice recognition program.  Efforts were made to edit the dictations but occasionally words are mis-transcribed.)        Jeff Frieden Marisa Severin, MD  Resident  07/20/20 [phone]

## 2020-07-20 NOTE — ED Provider Notes (Signed)
[facility]     Emergency Department     Faculty Note/ Attestation      Pt Name: Larry Tyler                                       MRN: [id number]  Birthdate 01-10-1976  Date of evaluation: 07/20/2020  Patients PCP:    No primary care provider on file.    Attestation  I performed a history and physical examination of the patient/ or directly observed  and discussed management with the resident. I reviewed the resident???s note and agree with the documented findings and plan of care. Any areas of disagreement are noted on the chart. I was personally present for the key portions of any procedures. I have documented in the chart those procedures where I was not present during the key portions. I have reviewed the emergency nurses triage note. I agree with the chief complaint, past medical history, past surgical history, allergies, medications, social and family history as documented unless otherwise noted below.    For Physician Assistant/ Nurse Practitioner cases/documentation I have personally evaluated this patient and have completed at least one if not all key elements of the E/M (history, physical exam, and MDM). Additional findings are as noted.    This patient was evaluated in the Emergency Department for symptoms described in the history of present illness. The patient was evaluated in the context of the global COVID-19 pandemic, which necessitated consideration that the patient might be at risk for infection with the SARS-CoV-2 virus that causes COVID-19. Institutional protocols and algorithms that pertain to the evaluation of patients at risk for COVID-19 are in a state of rapid change based on information released by regulatory bodies including the CDC and federal and state organizations. These policies and algorithms were followed during the patient's care in the ED.     Initial Screens:        Glasgow Coma Scale  Eye Opening: Spontaneous  Best Verbal Response: Oriented  Best  Motor Response: Obeys commands  Glasgow Coma Scale Score: 15    Vitals:    Vitals:    07/20/20 1410 07/20/20 1411 07/20/20 1418 07/20/20 1442   BP: (!) 182/107 (!) 174/92 (!) 165/97 (!) 150/96   Pulse: 95 83 77 69   Resp: 15 27 20     Temp:       TempSrc:       SpO2:   96% 96%   Weight:           Chief Complaint      Chief Complaint   Patient presents with   ??? Headache   ??? Chest Pain          weight is 210 lb (95.3 kg). His oral temperature is 99.4 ??F (37.4 ??C). His blood pressure is 150/96 (abnormal) and his pulse is 69. His respiration is 20 and oxygen saturation is 96%.            DIAGNOSTIC RESULTS       RADIOLOGY:   XR CHEST PORTABLE   Final Result   Mild scattered interstitial opacities.  This is nonspecific but can be seen   with interstitial edema as well as atypical/viral infection.         CT HEAD WO CONTRAST    (Results Pending)         LABS:  Labs Reviewed  CBC WITH AUTO DIFFERENTIAL - Abnormal; Notable for the following components:       Result Value    WBC 15.3 (*)     NRBC Automated 0.1 (*)     All other components within normal limits   BASIC METABOLIC PANEL   TROPONIN   TROPONIN   BRAIN NATRIURETIC PEPTIDE         EMERGENCY DEPARTMENT COURSE:     -------------------------      BP: (!) 150/96, Temp: 99.4 ??F (37.4 ??C), Pulse: 69, Resp: 20    System Problem List     There is no problem list on file for this patient.      Comments  Chronic Prob List noted    No notes of EC Admission Criteria type on file.          Junius Argyle, MD,, MD, F.A.C.E.P.  Attending Emergency Physician         Junius Argyle, MD  07/20/20 [phone]

## 2020-07-20 NOTE — ED Notes (Signed)
Pt to ER via EMS for c/o chest pain and headache. Pt states that he started having chest pain this morning and wife called EMS. Per EMS, nitroglycerin was given and aspirin was given.  Pt denies any other complaints at this time.   Pt placed on full cardiac monitor, RR even and NL. No acute distress noted.  Pt was noted to be hypertensive on arrival.  Dr. Suzanne Boron and Dr. Edison Pace at bedside to evaluate pt.      Darryll Capers, RN  07/20/20 [phone]

## 2020-07-20 NOTE — ED Notes (Signed)
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 Patient's son Alphia Kava at bedside. Patient stated his semi is parked at [employer] truck stop in [place], [place]. Verified address [address] [place], [place]. LSW scheduled [employer] to transport to truck stop.     Dani Gobble, LSW  07/20/20 1754

## 2020-07-20 NOTE — ED Notes (Signed)
The following labs labeled with pt sticker and tubed to lab:     [x]  Blue     [x]  Lavender   []  on ice  [x]  Green/yellow  []  Green/black []  on ice  [x]  Yellow  []  Red  []  Pink      []  COVID-19 swab    []  Rapid  []  PCR  []  Flu swab  []  Peds Viral Panel     []  Urine Sample  []  Pelvic Cultures  []  Blood Cultures          , RN  07/20/20 1417

## 2020-07-20 NOTE — Discharge Instructions (Signed)
Check your blood pressure for the next 2 weeks, every day and write down. Check during different times during the day.      You may need to take blood pressure medications for the remainder of your life.  Limit the amount of salt that you use. Increase amount of exercise (3 - 4 times a week for minimal of 20 minutes each times).  Eat fresh foods.    PLEASE RETURN TO THE EMERGENCY DEPARTMENT IMMEDIATELY for worsening symptoms, or if you develop any concerning symptoms such as: high fever not relieved by acetaminophen (Tylenol) and/or ibuprofen (Motrin / Advil), chills, shortness of breath, chest pain, feeling of your heart fluttering or racing, persistent nausea and/or vomiting, vomiting up blood, blood in your stool, numbness, loss of consciousness, weakness or tingling in the arms or legs or change in color of the extremities, changes in mental status, persistent headache, blurry vision, loss of bladder / bowel control, unable to follow up with your physician, or other any other care or concern.

## 2020-07-21 LAB — EKG 12-LEAD
Atrial Rate: 74 {beats}/min
P Axis: 39 degrees
P-R Interval: 144 ms
Q-T Interval: 350 ms
QRS Duration: 86 ms
QTc Calculation (Bazett): 388 ms
R Axis: 7 degrees
T Axis: 107 degrees
Ventricular Rate: 74 {beats}/min

## 2020-08-03 ENCOUNTER — Emergency Department
Admission: EM | Admit: 2020-08-03 | Discharge: 2020-08-04 | Disposition: A | Discharge status: Home / Self Care | Payer: Self-pay | Attending: Emergency Medicine | Admitting: Emergency Medicine

## 2020-08-03 ENCOUNTER — Emergency Department: Payer: Self-pay

## 2020-08-03 ENCOUNTER — Other Ambulatory Visit: Payer: Self-pay

## 2020-08-03 DIAGNOSIS — F17200 Nicotine dependence, unspecified, uncomplicated: Secondary | ICD-10-CM | POA: Insufficient documentation

## 2020-08-03 DIAGNOSIS — I10 Essential (primary) hypertension: Secondary | ICD-10-CM | POA: Insufficient documentation

## 2020-08-03 DIAGNOSIS — R200 Anesthesia of skin: Secondary | ICD-10-CM | POA: Insufficient documentation

## 2020-08-03 HISTORY — DX: Essential (primary) hypertension: I10

## 2020-08-03 LAB — CBC WITH DIFFERENTIAL/PLATELET
Abs Immature Granulocytes: 0.07 10*3/uL (ref 0.00–0.07)
Basophils Absolute: 0.1 10*3/uL (ref 0.0–0.1)
Basophils Relative: 0 %
Eosinophils Absolute: 0.2 10*3/uL (ref 0.0–0.5)
Eosinophils Relative: 2 %
HCT: 43.3 % (ref 39.0–52.0)
Hemoglobin: 14.9 g/dL (ref 13.0–17.0)
Immature Granulocytes: 1 %
Lymphocytes Relative: 14 %
Lymphs Abs: 1.7 10*3/uL (ref 0.7–4.0)
MCH: 31.2 pg (ref 26.0–34.0)
MCHC: 34.4 g/dL (ref 30.0–36.0)
MCV: 90.6 fL (ref 80.0–100.0)
Monocytes Absolute: 1.4 10*3/uL — ABNORMAL HIGH (ref 0.1–1.0)
Monocytes Relative: 11 %
Neutro Abs: 9.2 10*3/uL — ABNORMAL HIGH (ref 1.7–7.7)
Neutrophils Relative %: 72 %
Platelets: 273 10*3/uL (ref 150–400)
RBC: 4.78 MIL/uL (ref 4.22–5.81)
RDW: 13.7 % (ref 11.5–15.5)
WBC: 12.6 10*3/uL — ABNORMAL HIGH (ref 4.0–10.5)
nRBC: 0 % (ref 0.0–0.2)

## 2020-08-03 LAB — COMPREHENSIVE METABOLIC PANEL WITH GFR
ALT: 17 U/L (ref 0–44)
AST: 17 U/L (ref 15–41)
Albumin: 4.3 g/dL (ref 3.5–5.0)
Alkaline Phosphatase: 54 U/L (ref 38–126)
Anion gap: 11 (ref 5–15)
BUN: 16 mg/dL (ref 6–20)
CO2: 25 mmol/L (ref 22–32)
Calcium: 9.2 mg/dL (ref 8.9–10.3)
Chloride: 101 mmol/L (ref 98–111)
Creatinine, Ser: 1.3 mg/dL — ABNORMAL HIGH (ref 0.61–1.24)
GFR, Estimated: >60 mL/min
Glucose, Bld: 93 mg/dL (ref 70–99)
Potassium: 4.3 mmol/L (ref 3.5–5.1)
Sodium: 137 mmol/L (ref 135–145)
Total Bilirubin: 0.5 mg/dL (ref 0.3–1.2)
Total Protein: 7.5 g/dL (ref 6.5–8.1)

## 2020-08-03 LAB — TROPONIN I (HIGH SENSITIVITY): Troponin I (High Sensitivity): 9 ng/L

## 2020-08-03 LAB — MAGNESIUM: Magnesium: 2.4 mg/dL (ref 1.7–2.4)

## 2020-08-03 MED ORDER — LABETALOL HCL 5 MG/ML IV SOLN: 10.0000 mg | Freq: Once | INTRAVENOUS | Status: DC

## 2020-08-03 NOTE — ED Provider Notes (Signed)
[facility] Emergency Department Provider Note  ____________________________________________   Event Date/Time   First MD Initiated Contact with Patient 08/03/20 2259     (approximate)  I have reviewed the triage vital signs and the nursing notes.   HISTORY  Chief Complaint Hypertension and Numbness    HPI Gregory Pratt is a 45 y.o. male with history of hypertension, tobacco use who presents to the emergency department with left arm numbness, tingling that started 2 days ago and has improved but not completely resolved.  No history of previous CVA, TIA.  No headache, head injury.  States he also had some facial weakness and speech changes.  No weakness.  No chest pain, shortness of breath.  Also reports some cramping in the left arm.  No fevers, cough, vomiting or diarrhea.  Does not have a primary care physician.  No neck or back pain.        Past Medical History:  Diagnosis Date  . Hypertension     There are no problems to display for this patient.   History reviewed. No pertinent surgical history.  Prior to Admission medications   Not on File    Allergies Penicillins and Sulfamethoxazole-trimethoprim  No family history on file.  Social History Social History   Tobacco Use  . Smoking status: Current Every Day Smoker  . Smokeless tobacco: Never Used  Substance Use Topics  . Alcohol use: Never  . Drug use: Never    Review of Systems Constitutional: No fever. Eyes: No visual changes. ENT: No sore throat. Cardiovascular: Denies chest pain. Respiratory: Denies shortness of breath. Gastrointestinal: No nausea, vomiting, diarrhea. Genitourinary: Negative for dysuria. Musculoskeletal: Negative for back pain. Skin: Negative for rash. Neurological: Negative for focal weakness or numbness.  ____________________________________________   PHYSICAL EXAM:  VITAL SIGNS: ED Triage Vitals  Enc Vitals Group     BP 08/03/20 1944 (!)  178/93     Pulse Rate 08/03/20 1944 (!) 102     Resp 08/03/20 1944 20     Temp 08/03/20 1944 98.7 F (37.1 C)     Temp Source 08/03/20 1944 Oral     SpO2 08/03/20 1944 100 %     Weight 08/03/20 1944 215 lb (97.5 kg)     Height 08/03/20 1944 5\' 10"  (1.778 m)     Head Circumference --      Peak Flow --      Pain Score 08/03/20 1956 0     Pain Loc --      Pain Edu? --      Excl. in GC? --    CONSTITUTIONAL: Alert and oriented and responds appropriately to questions. Well-appearing; well-nourished HEAD: Normocephalic, atraumatic EYES: Conjunctivae clear, pupils appear equal, EOM appear intact ENT: normal nose; moist mucous membranes NECK: Supple, normal ROM CARD: RRR; S1 and S2 appreciated; no murmurs, no clicks, no rubs, no gallops RESP: Normal chest excursion without splinting or tachypnea; breath sounds clear and equal bilaterally; no wheezes, no rhonchi, no rales, no hypoxia or respiratory distress, speaking full sentences ABD/GI: Normal bowel sounds; non-distended; soft, non-tender, no rebound, no guarding, no peritoneal signs, no hepatosplenomegaly BACK: The back appears normal EXT: Normal ROM in all joints; no deformity noted, no edema; no cyanosis SKIN: Normal color for age and race; warm; no rash on exposed skin NEURO: Moves all extremities equally, no drift, reports diminished sensation in the left arm and leg compared to the right, normal sensation in the face, cranial nerves II through  XII intact, speech is slow but no aphasia or dysarthria PSYCH: The patient's mood and manner are appropriate.  ____________________________________________   LABS (all labs ordered are listed, but only abnormal results are displayed)  Labs Reviewed  CBC WITH DIFFERENTIAL/PLATELET - Abnormal; Notable for the following components:      Result Value   WBC 12.6 (*)    Neutro Abs 9.2 (*)    Monocytes Absolute 1.4 (*)    All other components within normal limits  COMPREHENSIVE METABOLIC  PANEL - Abnormal; Notable for the following components:   Creatinine, Ser 1.30 (*)    All other components within normal limits  MAGNESIUM  TROPONIN I (HIGH SENSITIVITY)   ____________________________________________  EKG   EKG Interpretation  Date/Time:  Tuesday Aug 03 2020 19:42:49 EDT Ventricular Rate:  101 PR Interval:  146 QRS Duration: 84 QT Interval:  298 QTC Calculation: 386 R Axis:   90 Text Interpretation: Sinus tachycardia Rightward axis Anterior infarct , age undetermined T wave abnormality, consider inferolateral ischemia Abnormal ECG No old tracing to compare Confirmed by Rochele Raring [phone]) on 08/03/2020 11:54:06 PM       ____________________________________________  RADIOLOGY Normajean Baxter Jedediah Noda, personally viewed and evaluated these images (plain radiographs) as part of my medical decision making, as well as reviewing the written report by the radiologist.  ED MD interpretation: MRI shows no acute abnormality.  CT head shows no acute abnormality.  Official radiology report(s): CT Head Wo Contrast  Result Date: 08/03/2020 CLINICAL DATA:  Neuro deficit, acute, stroke suspected EXAM: CT HEAD WITHOUT CONTRAST TECHNIQUE: Contiguous axial images were obtained from the base of the skull through the vertex without intravenous contrast. COMPARISON:  None. FINDINGS: Brain: No intracranial hemorrhage, mass effect, or midline shift. No hydrocephalus. The basilar cisterns are patent. Chronic lacunar infarct versus prominent perivascular space in the anterior limb of the left internal capsule. No evidence of territorial infarct or acute ischemia. No extra-axial or intracranial fluid collection. Vascular: No hyperdense vessel or unexpected calcification. Skull: Normal. Negative for fracture or focal lesion. Sinuses/Orbits: Paranasal sinuses and mastoid air cells are clear. The visualized orbits are unremarkable. Other: None. IMPRESSION: 1. No acute intracranial abnormality. 2.  Chronic lacunar infarct versus prominent perivascular space in the anterior limb of the left internal capsule. Electronically Signed   By: Narda Rutherford M.D.   On: 08/03/2020 20:44    ____________________________________________   PROCEDURES  Procedure(s) performed (including Critical Care):  Procedures  ____________________________________________   INITIAL IMPRESSION / ASSESSMENT AND PLAN / ED COURSE  As part of my medical decision making, I reviewed the following data within the electronic MEDICAL RECORD NUMBER Nursing notes reviewed and incorporated, Labs reviewed , EKG interpreted , Old EKG reviewed, Old chart reviewed, Radiograph reviewed  and Notes from prior ED visits         Patient here with strokelike symptoms.  Symptoms improving and ongoing for 2 days.  He is out of window for tPA.  CT head obtained in triage shows no acute abnormality.  Will obtain MRI of the brain for further evaluation.  Differential includes CVA, TIA, intracranial hemorrhage.  Doubt CVT, meningitis.  He does have a mild leukocytosis with left shift here but no infectious symptoms.  Also complaining of cramping in the left hand.  Potassium normal.  We will also check magnesium.  Blood pressure initially elevated to 178/93 but is currently 141/90.  ED PROGRESS  Patient's MRI brain shows no acute abnormality.  Reports numbness, cramping improving.  Blood pressure now 113/81.  Seems atypical for a TIA given he reports that the numbness was only in parts of his left arm and leg.  Given he is feeling better with reassuring work-up today, I feel he is safe to be discharged with close outpatient follow-up.  He is comfortable with this plan.   At this time, I do not feel there is any life-threatening condition present. I have reviewed, interpreted and discussed all results (EKG, imaging, lab, urine as appropriate) and exam findings with patient/family. I have reviewed nursing notes and appropriate previous  records.  I feel the patient is safe to be discharged home without further emergent workup and can continue workup as an outpatient as needed. Discussed usual and customary return precautions. Patient/family verbalize understanding and are comfortable with this plan.  Outpatient follow-up has been provided as needed. All questions have been answered.  ____________________________________________   FINAL CLINICAL IMPRESSION(S) / ED DIAGNOSES  Final diagnoses:  Left sided numbness     ED Discharge Orders    None      *Please note:  Gregory Pratt was evaluated in Emergency Department on 08/04/2020 for the symptoms described in the history of present illness. He was evaluated in the context of the global COVID-19 pandemic, which necessitated consideration that the patient might be at risk for infection with the SARS-CoV-2 virus that causes COVID-19. Institutional protocols and algorithms that pertain to the evaluation of patients at risk for COVID-19 are in a state of rapid change based on information released by regulatory bodies including the CDC and federal and state organizations. These policies and algorithms were followed during the patient's care in the ED.  Some ED evaluations and interventions may be delayed as a result of limited staffing during and the pandemic.*   Note:  This document was prepared using Dragon voice recognition software and may include unintentional dictation errors.   Jairus Tonne, Layla Maw, DO 08/04/20 [phone]

## 2020-08-03 NOTE — ED Triage Notes (Signed)
Pt states he noticed yesterday his left hand started cramping and then he began to cramp up all on the left side. Pt states he is having some tingling in the left hand now, denies headache currently states he had one earlier tho. Pt states he thinks his speech is more sluggish than normal and he is walking slower. No facial droop noted. Pt is able to move all extremities.

## 2020-08-03 NOTE — ED Notes (Signed)
Patient provided with phone for MRI screening. 

## 2020-08-04 ENCOUNTER — Emergency Department: Payer: Self-pay

## 2020-08-04 NOTE — ED Notes (Signed)
Patient back to room from MRI

## 2020-08-04 NOTE — ED Notes (Signed)
Patient transported to MRI 

## 2020-08-04 NOTE — ED Notes (Signed)
Patient wife, Marylene Land, can be reached at [phone].

## 2020-08-04 NOTE — Discharge Instructions (Addendum)
Your lab work, EKG and MRI brain were reassuring today.  Please follow-up with your primary care doctor.  Steps to find a Primary Care Provider (PCP):  Call [phone] or [phone] to access "[facility] Find a Doctor Service."  2.  You may also go on the [facility] website at [email or web address]

## 2020-08-04 NOTE — ED Notes (Signed)
ED provider at bedside with update on results and plan of care.

## 2021-09-23 IMAGING — MR MR HEAD W/O CM
11 series · 44 of 48 positions shown · non-contrast
Comparison: Prior head CT from 08/03/2020.

CLINICAL DATA: Initial evaluation for neuro deficit, stroke
suspected.

EXAM:
MRI HEAD WITHOUT CONTRAST
TECHNIQUE: Multiplanar, multiecho pulse sequences of the brain and surrounding
structures were obtained without intravenous contrast.

[Series 5: ax dwi_tracew · axial · 3.0mm · 0.65mm/px · z∈[-116,+31]mm · 5 of 48 slices shown]
[im 1/48]
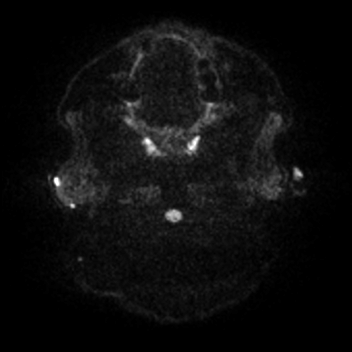
[im 12/48]
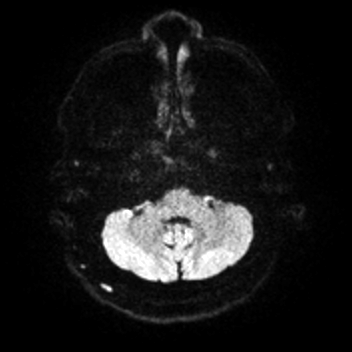
[im 24/48]
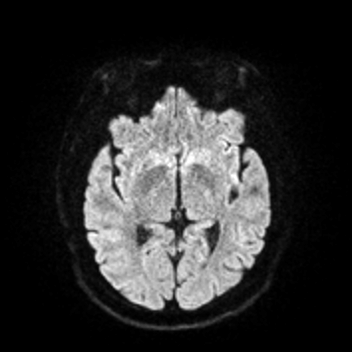
[im 36/48]
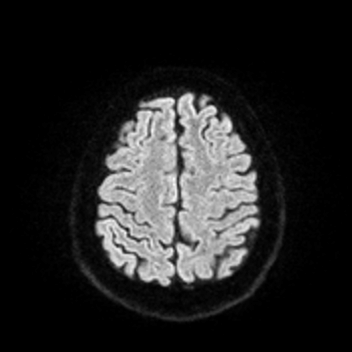
[im 48/48]
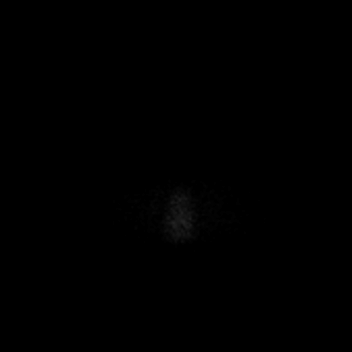

[Series 6: ax dwi_adc · axial · 3.0mm · 0.65mm/px · z∈[-116,+27]mm · 4 of 47 slices shown]
[im 1/47]
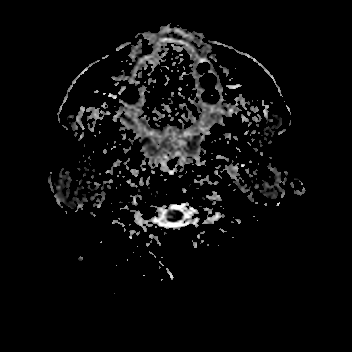
[im 16/47]
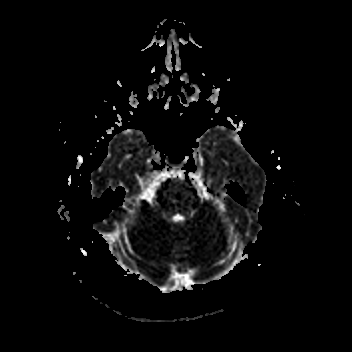
[im 31/47]
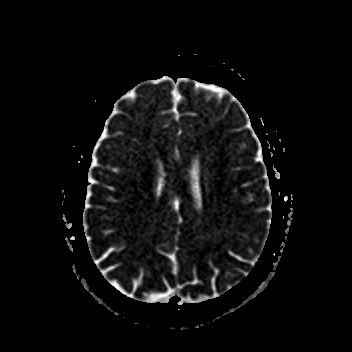
[im 47/47]
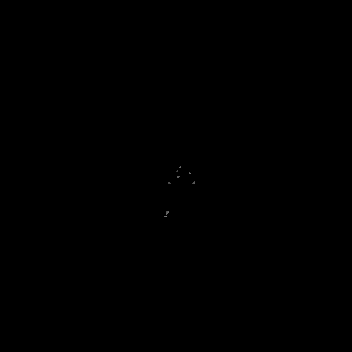

[Series 7: cor dwi_tracew · coronal · 5.0mm · 0.65mm/px · 3 of 36 slices shown]
[im 1/36]
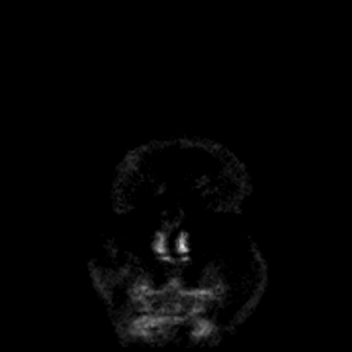
[im 18/36]
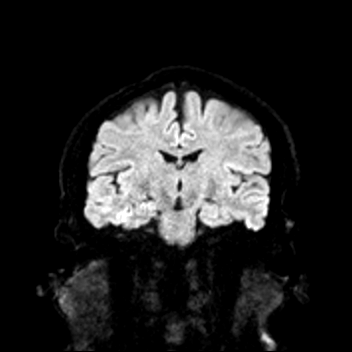
[im 36/36]
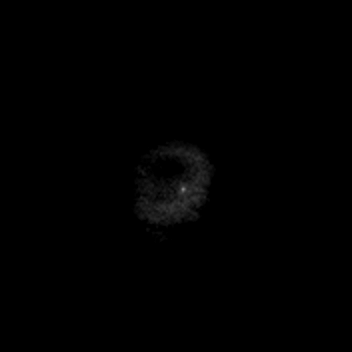

[Series 8: cor dwi_adc · coronal · 5.0mm · 0.65mm/px · 3 of 36 slices shown]
[im 1/36]
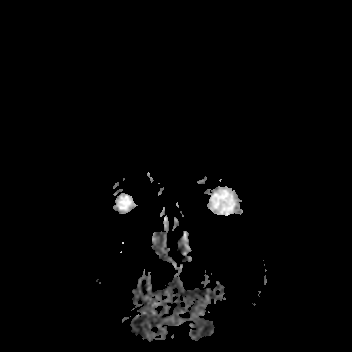
[im 18/36]
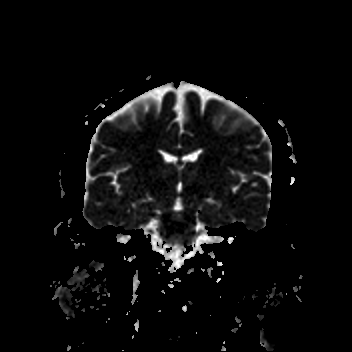
[im 36/36]
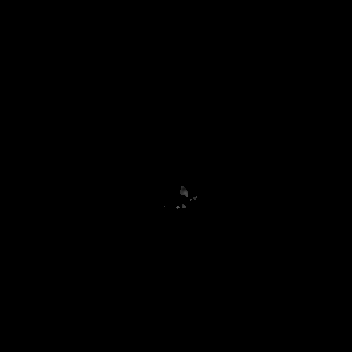

[Series 9: T1 · sagittal · 5.0mm · 0.62mm/px · 2 of 22 slices shown (1 of 2)]
[im 1/22]
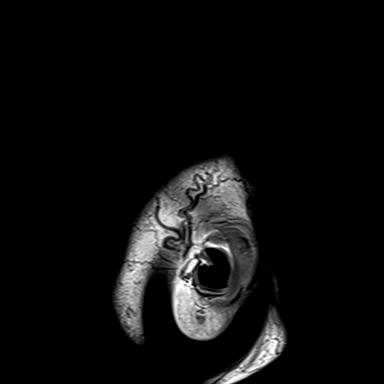
[im 22/22]
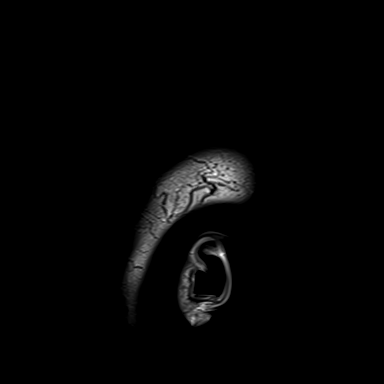

[Series 10: T2 · axial · 5.0mm · 0.53mm/px · z∈[-108,+26]mm · 2 of 25 slices shown (1 of 2)]
[im 1/25]
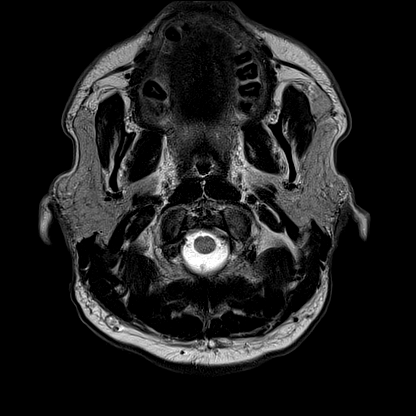
[im 25/25]
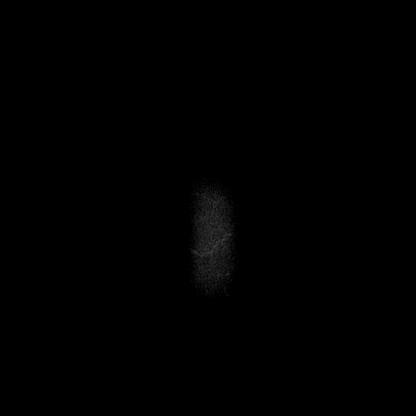

[Series 12: pha_images · axial · 3.0mm · 0.90mm/px · z∈[-125,+24]mm · 4 of 53 slices shown]
[im 1/53]
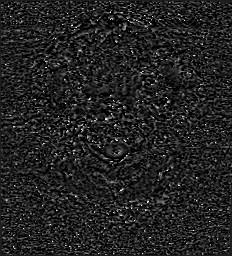
[im 18/53]
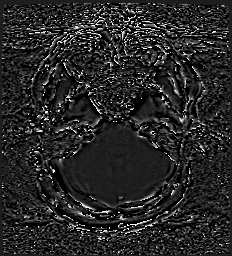
[im 35/53]
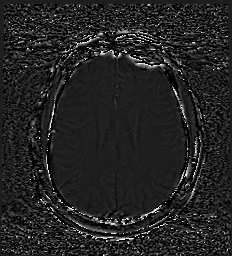
[im 53/53]
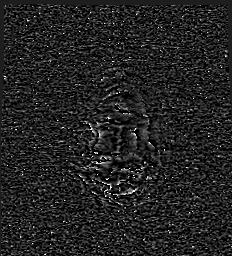

[Series 13: swi_images · axial · 3.0mm · 0.90mm/px · z∈[-125,+40]mm · 5 of 60 slices shown]
[im 1/60]
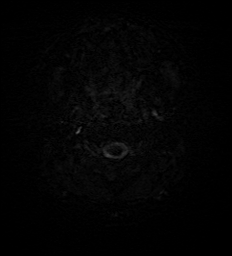
[im 15/60]
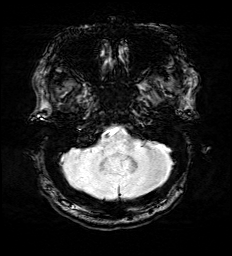
[im 30/60]
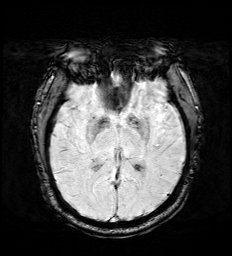
[im 45/60]
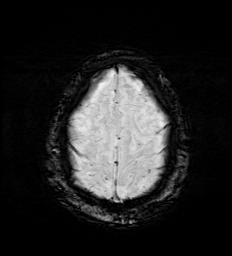
[im 60/60]
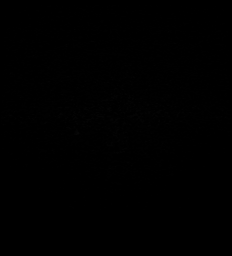

[Series 15: FLAIR · axial · 3.0mm · 0.53mm/px · z∈[-117,+35]mm · 5 of 55 slices shown]
[im 1/55]
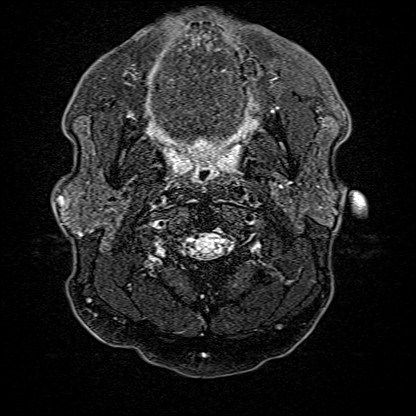
[im 14/55]
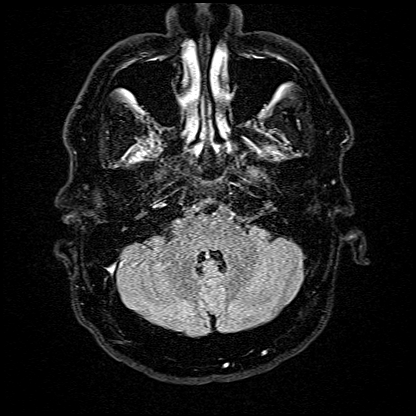
[im 28/55]
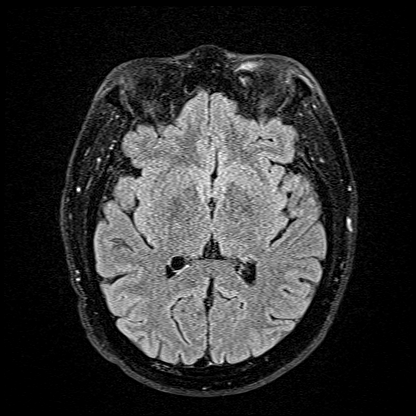
[im 41/55]
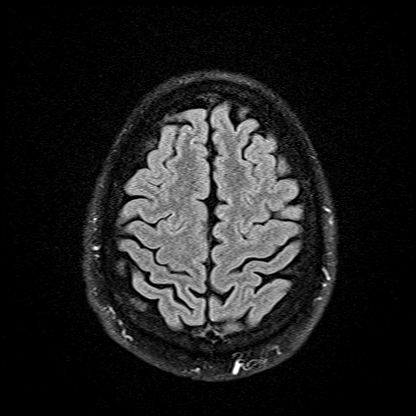
[im 55/55]
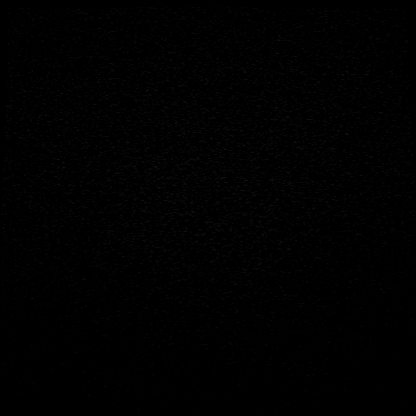

[Series 16: T1 · axial · 1.0mm · 0.98mm/px · z∈[-121,+28]mm · 9 of 159 slices shown (2 of 2)]
[im 1/159]
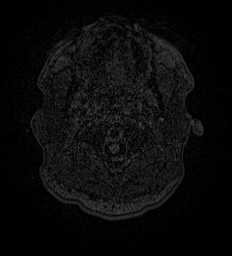
[im 14/159]
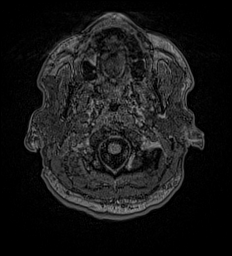
[im 27/159]
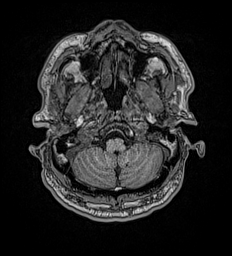
[im 53/159]
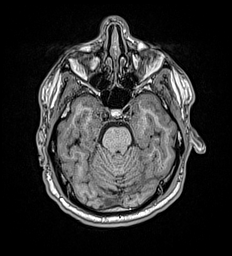
[im 66/159]
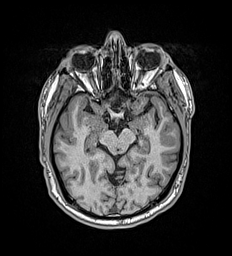
[im 93/159]
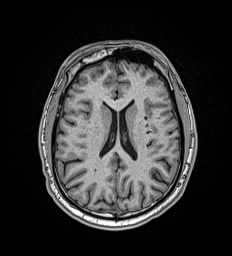
[im 106/159]
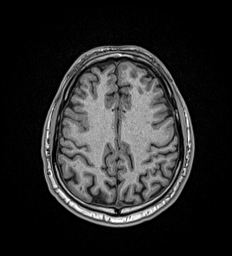
[im 132/159]
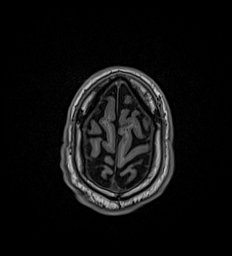
[im 159/159]
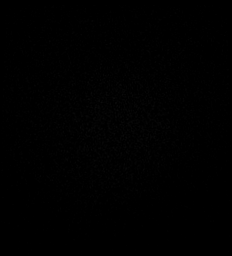

[Series 17: T2 · coronal · 5.0mm · 0.57mm/px · 2 of 28 slices shown (2 of 2)]
[im 1/28]
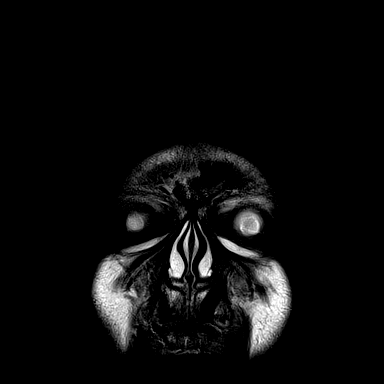
[im 28/28]
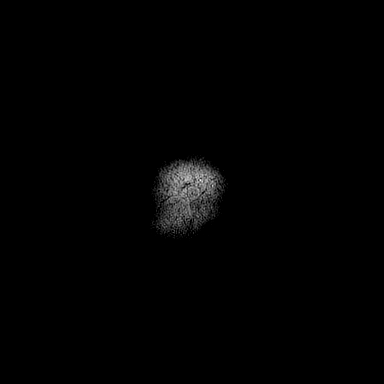

[44 of 48 positions shown; findings below may reference images not displayed]

FINDINGS: Brain: Cerebral volume within normal limits for age. No significant
cerebral white matter disease or other focal parenchymal signal
abnormality.

No abnormal foci of restricted diffusion to suggest acute or
subacute ischemia. Gray-white matter differentiation maintained. No
encephalomalacia to suggest chronic cortical infarction. No evidence
for acute or chronic intracranial hemorrhage.

No mass lesion, midline shift or mass effect. Ventricles normal size
without hydrocephalus. Incidental note made of a probable 8 mm
benign appearing cord plexus cyst near the atrium of the right
lateral ventricle, of doubtful significance. No extra-axial fluid
collection. Pituitary gland suprasellar region within normal limits.
Two [place]s not well visualized on this non pituitary protocol
MRI. Midline structures intact and within normal limits.

Vascular: Major intracranial vascular flow voids are well
maintained.

Skull and upper cervical spine: Craniocervical junction within
normal limits. Bone marrow signal intensity normal. No scalp soft
tissue abnormality.

Sinuses/Orbits: Globes and orbital soft tissues within normal
limits. Paranasal sinuses are clear. No mastoid effusion. Inner ear
structures within normal limits.

Other: None.
IMPRESSION: Normal brain MRI. No acute intracranial abnormality identified.
